# Patient Record
Sex: Female | Born: 1990 | Race: Black or African American | Hispanic: No | Marital: Single | Smoking: Former smoker
Health system: Southern US, Community
[De-identification: ages and names within clinical notes are randomized; demographics above are authoritative.]

## PROBLEM LIST (undated history)

## (undated) ENCOUNTER — Inpatient Hospital Stay (HOSPITAL_COMMUNITY): Payer: Self-pay

## (undated) DIAGNOSIS — J45909 Unspecified asthma, uncomplicated: Secondary | ICD-10-CM

## (undated) DIAGNOSIS — G8929 Other chronic pain: Secondary | ICD-10-CM

## (undated) DIAGNOSIS — D649 Anemia, unspecified: Secondary | ICD-10-CM

## (undated) DIAGNOSIS — F419 Anxiety disorder, unspecified: Secondary | ICD-10-CM

## (undated) DIAGNOSIS — M419 Scoliosis, unspecified: Secondary | ICD-10-CM

## (undated) DIAGNOSIS — O24419 Gestational diabetes mellitus in pregnancy, unspecified control: Secondary | ICD-10-CM

## (undated) DIAGNOSIS — M549 Dorsalgia, unspecified: Secondary | ICD-10-CM

## (undated) DIAGNOSIS — E871 Hypo-osmolality and hyponatremia: Secondary | ICD-10-CM

## (undated) HISTORY — DX: Other chronic pain: G89.29

## (undated) HISTORY — DX: Dorsalgia, unspecified: M54.9

## (undated) HISTORY — DX: Anxiety disorder, unspecified: F41.9

## (undated) HISTORY — PX: NO PAST SURGERIES: SHX2092

## (undated) HISTORY — DX: Hypo-osmolality and hyponatremia: E87.1

## (undated) HISTORY — DX: Hypocalcemia: E83.51

---

## 2011-06-25 ENCOUNTER — Emergency Department: Payer: Self-pay | Admitting: *Deleted

## 2014-12-29 ENCOUNTER — Emergency Department
Admission: EM | Admit: 2014-12-29 | Discharge: 2014-12-29 | Disposition: A | Discharge status: Home / Self Care | Payer: Self-pay | Attending: Emergency Medicine | Admitting: Emergency Medicine

## 2014-12-29 ENCOUNTER — Encounter: Payer: Self-pay | Admitting: *Deleted

## 2014-12-29 DIAGNOSIS — Z720 Tobacco use: Secondary | ICD-10-CM | POA: Insufficient documentation

## 2014-12-29 DIAGNOSIS — J020 Streptococcal pharyngitis: Secondary | ICD-10-CM | POA: Insufficient documentation

## 2014-12-29 HISTORY — DX: Unspecified asthma, uncomplicated: J45.909

## 2014-12-29 MED ORDER — AMOXICILLIN 500 MG PO CAPS
500.0000 mg | ORAL_CAPSULE | Freq: Once | ORAL | Status: AC
  Administered 2014-12-29: 500 mg via ORAL
  Filled 2014-12-29: qty 1

## 2014-12-29 MED ORDER — AMOXICILLIN 500 MG PO CAPS: 500.0000 mg | ORAL_CAPSULE | Freq: Three times a day (TID) | ORAL | Status: DC

## 2014-12-29 NOTE — ED Provider Notes (Signed)
[facility] Emergency Department Provider Note  ____________________________________________  Time seen: Approximately 7:54 PM  I have reviewed the triage vital signs and the nursing notes.   HISTORY  Chief Complaint Sore Throat  HPI Alexandria Beltran is a 24 y.o. female is here with complaint of sore throat and fever for 2 days. Patient states that she has a history of strep and this feels very similar. She states that she has had some fever but has not actually taken it. She is able to swallow the tablets and fluids. Throat pain is worse with swallowing. Currently her pain is 7 out of 10. Nothing has made her throat feel better.   Past Medical History  Diagnosis Date  . Asthma     There are no active problems to display for this patient.   History reviewed. No pertinent past surgical history.  Current Outpatient Rx  Name  Route  Sig  Dispense  Refill  . amoxicillin (AMOXIL) 500 MG capsule   Oral   Take 1 capsule (500 mg total) by mouth 3 (three) times daily.   30 capsule   0     Allergies Review of patient's allergies indicates no known allergies.  History reviewed. No pertinent family history.  Social History Social History  Substance Use Topics  . Smoking status: Current Every Day Smoker  . Smokeless tobacco: None  . Alcohol Use: Yes     Comment: occasionally    Review of Systems Constitutional: Positive fever/chills Eyes: No visual changes. ENT: Positive sore throat. Cardiovascular: Denies chest pain. Respiratory: Denies shortness of breath. Gastrointestinal: No abdominal pain.  No nausea, no vomiting.  Musculoskeletal: Negative for back pain. Skin: Negative for rash. Neurological: Negative for headaches, focal weakness or numbness.  10-point ROS otherwise negative.  ____________________________________________   PHYSICAL EXAM:  VITAL SIGNS: ED Triage Vitals  Enc Vitals Group     BP 12/29/14 1919 120/71 mmHg     Pulse  Rate 12/29/14 1919 107     Resp 12/29/14 1919 18     Temp 12/29/14 1919 98.2 F (36.8 C)     Temp Source 12/29/14 1919 Oral     SpO2 12/29/14 1919 97 %     Weight 12/29/14 1919 230 lb (104.327 kg)     Height 12/29/14 1919 6' (1.829 m)     Head Cir --      Peak Flow --      Pain Score 12/29/14 1921 7     Pain Loc --      Pain Edu? --      Excl. in GC? --     Constitutional: Alert and oriented. Well appearing and in no acute distress. Eyes: Conjunctivae are normal. PERRL. EOMI. Head: Atraumatic. Nose: No congestion/rhinnorhea. Mouth/Throat: Mucous membranes are moist.  Oropharynx moderate erythema with bilateral tonsillar exudate. Patient does talk with a muffled voice. Neck: No stridor.   Hematological/Lymphatic/Immunilogical: Positive Bilateral cervical lymphadenopathy. Cardiovascular: Normal rate, regular rhythm. Grossly normal heart sounds.  Good peripheral circulation. Respiratory: Normal respiratory effort.  No retractions. Lungs CTAB. Gastrointestinal: Soft and nontender. No distention. Musculoskeletal: No lower extremity tenderness nor edema.  No joint effusions. Neurologic:  Normal speech and language. No gross focal neurologic deficits are appreciated. No gait instability. Skin:  Skin is warm, dry and intact. No rash noted. Psychiatric: Mood and affect are normal. Speech and behavior are normal.  ____________________________________________   LABS (all labs ordered are listed, but only abnormal results are displayed)  Labs Reviewed -  No data to display    PROCEDURES  Procedure(s) performed: None  Critical Care performed: No  ____________________________________________   INITIAL IMPRESSION / ASSESSMENT AND PLAN / ED COURSE  Pertinent labs & imaging results that were available during my care of the patient were reviewed by me and considered in my medical decision making (see chart for details).  Patient was started on amoxicillin 500 mg tonight in the  emergency room and given a prescription for a course for 10 days. She is not having any difficulty swallowing and is encouraged to drink plenty of fluids. Also Tylenol or ibuprofen as needed for throat pain. ____________________________________________   FINAL CLINICAL IMPRESSION(S) / ED DIAGNOSES  Final diagnoses:  Pharyngitis, streptococcal, acute      Tommi Rumps, PA-C 12/29/14 2053  Jene Every, MD 12/29/14 [phone]

## 2014-12-29 NOTE — ED Notes (Signed)
Discussed discharge instructions, prescriptions, and follow-up care with patient. No questions or concerns at this time. Pt stable at discharge.  

## 2014-12-29 NOTE — ED Notes (Signed)
Patient c/o sore throat, fever for two days. Patient states she has a history of strep throat.

## 2015-01-04 LAB — POCT RAPID STREP A: Streptococcus, Group A Screen (Direct): POSITIVE — AB

## 2016-02-10 LAB — OB RESULTS CONSOLE HEPATITIS B SURFACE ANTIGEN: HEP B S AG: NEGATIVE

## 2016-02-10 LAB — OB RESULTS CONSOLE ABO/RH: RH Type: POSITIVE

## 2016-02-10 LAB — OB RESULTS CONSOLE RPR: RPR: NONREACTIVE

## 2016-02-10 LAB — CYTOLOGY - PAP: Pap: NEGATIVE

## 2016-02-10 LAB — OB RESULTS CONSOLE RUBELLA ANTIBODY, IGM: RUBELLA: IMMUNE

## 2016-02-10 LAB — OB RESULTS CONSOLE HIV ANTIBODY (ROUTINE TESTING): HIV: NONREACTIVE

## 2016-02-10 LAB — OB RESULTS CONSOLE GC/CHLAMYDIA
Chlamydia: NEGATIVE
Gonorrhea: NEGATIVE

## 2016-02-10 LAB — OB RESULTS CONSOLE ANTIBODY SCREEN: Antibody Screen: NEGATIVE

## 2016-02-10 LAB — OB RESULTS CONSOLE VARICELLA ZOSTER ANTIBODY, IGG: Varicella: IMMUNE

## 2016-02-24 NOTE — L&D Delivery Note (Signed)
26 y.o. G1P0 at 162w3d delivered a viable female infant in cephalic, ROA position. No nuchal cord. Left anterior shoulder delivered with ease. 60 sec delayed cord clamping. Cord clamped x2 and cut. Placenta delivered spontaneously intact, with 3VC. Fundus firm on exam with massage and pitocin. Good hemostasis noted.  Anesthesia: Epidural; local anesthesia Laceration: Hymenal tear in right vagina Suture: 3-0 Vicryl Good hemostasis noted. EBL: 300cc  Mom and baby recovering in LDR.    Apgars: APGAR (1 MIN): 8   APGAR (5 MINS): 9      Weight: Pending skin to skin  Sponge and instrument count were correct x2. Placenta sent to L&D  Jen MowElizabeth Mumaw, DO [facility] 07/24/2016, 12:42 PM

## 2016-05-28 ENCOUNTER — Encounter: Payer: Self-pay | Admitting: Emergency Medicine

## 2016-05-28 ENCOUNTER — Emergency Department (HOSPITAL_COMMUNITY)
Admission: EM | Admit: 2016-05-28 | Discharge: 2016-05-28 | Disposition: A | Discharge status: Home / Self Care | Payer: Medicaid Other | Attending: Emergency Medicine | Admitting: Emergency Medicine

## 2016-05-28 DIAGNOSIS — O99333 Smoking (tobacco) complicating pregnancy, third trimester: Secondary | ICD-10-CM | POA: Diagnosis not present

## 2016-05-28 DIAGNOSIS — Z3490 Encounter for supervision of normal pregnancy, unspecified, unspecified trimester: Secondary | ICD-10-CM

## 2016-05-28 DIAGNOSIS — Z79899 Other long term (current) drug therapy: Secondary | ICD-10-CM | POA: Diagnosis not present

## 2016-05-28 DIAGNOSIS — F17200 Nicotine dependence, unspecified, uncomplicated: Secondary | ICD-10-CM | POA: Insufficient documentation

## 2016-05-28 DIAGNOSIS — Z3A32 32 weeks gestation of pregnancy: Secondary | ICD-10-CM | POA: Diagnosis not present

## 2016-05-28 DIAGNOSIS — J45909 Unspecified asthma, uncomplicated: Secondary | ICD-10-CM | POA: Diagnosis not present

## 2016-05-28 DIAGNOSIS — O9989 Other specified diseases and conditions complicating pregnancy, childbirth and the puerperium: Secondary | ICD-10-CM | POA: Diagnosis present

## 2016-05-28 DIAGNOSIS — O99513 Diseases of the respiratory system complicating pregnancy, third trimester: Secondary | ICD-10-CM | POA: Diagnosis not present

## 2016-05-28 DIAGNOSIS — J0100 Acute maxillary sinusitis, unspecified: Secondary | ICD-10-CM

## 2016-05-28 MED ORDER — AMOXICILLIN 500 MG PO CAPS
500.0000 mg | ORAL_CAPSULE | Freq: Once | ORAL | Status: AC
  Administered 2016-05-28: 500 mg via ORAL
  Filled 2016-05-28: qty 1

## 2016-05-28 MED ORDER — AMOXICILLIN 500 MG PO CAPS: 500.0000 mg | ORAL_CAPSULE | Freq: Three times a day (TID) | ORAL | 0 refills | Status: DC

## 2016-05-28 NOTE — ED Triage Notes (Signed)
Pt reports she thinks she has a sinus infection and describes symptoms such as nasal congestion, asthma, and sore throat; onset two days ago. She is also 8 months pregnant but denies any abnormal pain or cramping. She [facility] but decided to come here because she isnt sure what to take for her sinus infection due to her pregnancy.

## 2016-05-28 NOTE — ED Notes (Signed)
Patient was alert, oriented and stable upon discharge. RN went over AVS and patient had no further questions.  

## 2016-05-28 NOTE — ED Notes (Signed)
ED Provider at bedside. 

## 2016-05-28 NOTE — Discharge Instructions (Signed)
Prescription for antibiotic. Increase fluids. Tylenol. Gargle with salt water

## 2016-05-30 NOTE — ED Provider Notes (Signed)
[facility]-EMERGENCY DEPT Provider Note   CSN: [id number] Arrival date & time: 05/28/16  1455     History   Chief Complaint Chief Complaint  Patient presents with  . Nasal Congestion    HPI Deniz Eskridge is a 26 y.o. female.  Pregnant woman presents with a sensation of sinus congestion and nasal discharge. No fever, sweats, chills, vaginal bleeding, vaginal discharge. He is approximately 8 months pregnant. Severity of symptoms is mild.      Past Medical History:  Diagnosis Date  . Asthma     There are no active problems to display for this patient.   No past surgical history on file.  OB History    Gravida Para Term Preterm AB Living   1             SAB TAB Ectopic Multiple Live Births                   Home Medications    Prior to Admission medications   Medication Sig Start Date End Date Taking? Authorizing Provider  amoxicillin (AMOXIL) 500 MG capsule Take 1 capsule (500 mg total) by mouth 3 (three) times daily. 05/28/16   Donnetta Hutching, MD    Family History No family history on file.  Social History Social History  Substance Use Topics  . Smoking status: Current Every Day Smoker  . Smokeless tobacco: Not on file  . Alcohol use Yes     Comment: occasionally     Allergies   Patient has no known allergies.   Review of Systems Review of Systems  All other systems reviewed and are negative.    Physical Exam Updated Vital Signs BP 123/72 (BP Location: Right Arm)   Pulse 100   Temp 98.2 F (36.8 C) (Oral)   Resp 17   SpO2 94%   Physical Exam  Constitutional: She is oriented to person, place, and time. She appears well-developed and well-nourished.  HENT:  Head: Normocephalic and atraumatic.  Bilateral tenderness over the maxillary sinuses  Eyes: Conjunctivae are normal.  Neck: Neck supple.  Cardiovascular: Normal rate and regular rhythm.   Pulmonary/Chest: Effort normal and breath sounds normal.  Abdominal:  Gravid, nontender    Musculoskeletal: Normal range of motion.  Neurological: She is alert and oriented to person, place, and time.  Skin: Skin is warm and dry.  Psychiatric: She has a normal mood and affect. Her behavior is normal.  Nursing note and vitals reviewed.    ED Treatments / Results  Labs (all labs ordered are listed, but only abnormal results are displayed) Labs Reviewed - No data to display  EKG  EKG Interpretation None       Radiology No results found.  Procedures Procedures (including critical care time)  Medications Ordered in ED Medications  amoxicillin (AMOXIL) capsule 500 mg (500 mg Oral Given 05/28/16 1536)     Initial Impression / Assessment and Plan / ED Course  I have reviewed the triage vital signs and the nursing notes.  Pertinent labs & imaging results that were available during my care of the patient were reviewed by me and considered in my medical decision making (see chart for details).     Patient is nontoxic-appearing. Will Rx amoxicillin for maxillary sinusitis. No evidence of pregnancy related issues.  Final Clinical Impressions(s) / ED Diagnoses   Final diagnoses:  Acute non-recurrent maxillary sinusitis  Pregnancy, unspecified gestational age    New Prescriptions Discharge Medication List as of 05/28/2016  3:32  PM       Donnetta Hutching, MD 05/30/16 1409

## 2016-06-22 ENCOUNTER — Emergency Department (HOSPITAL_COMMUNITY)
Admission: EM | Admit: 2016-06-22 | Discharge: 2016-06-22 | Disposition: A | Discharge status: Home / Self Care | Payer: Medicaid Other | Attending: Emergency Medicine | Admitting: Emergency Medicine

## 2016-06-22 ENCOUNTER — Emergency Department (HOSPITAL_COMMUNITY): Payer: Medicaid Other

## 2016-06-22 ENCOUNTER — Encounter (HOSPITAL_COMMUNITY): Payer: Self-pay | Admitting: Emergency Medicine

## 2016-06-22 DIAGNOSIS — F17200 Nicotine dependence, unspecified, uncomplicated: Secondary | ICD-10-CM | POA: Diagnosis not present

## 2016-06-22 DIAGNOSIS — Z3A37 37 weeks gestation of pregnancy: Secondary | ICD-10-CM | POA: Diagnosis not present

## 2016-06-22 DIAGNOSIS — J45909 Unspecified asthma, uncomplicated: Secondary | ICD-10-CM

## 2016-06-22 DIAGNOSIS — O99333 Smoking (tobacco) complicating pregnancy, third trimester: Secondary | ICD-10-CM | POA: Diagnosis not present

## 2016-06-22 DIAGNOSIS — O99513 Diseases of the respiratory system complicating pregnancy, third trimester: Secondary | ICD-10-CM | POA: Insufficient documentation

## 2016-06-22 MED ORDER — ALBUTEROL SULFATE HFA 108 (90 BASE) MCG/ACT IN AERS
2.0000 | INHALATION_SPRAY | Freq: Once | RESPIRATORY_TRACT | Status: AC
  Administered 2016-06-22: 2 via RESPIRATORY_TRACT
  Filled 2016-06-22: qty 6.7

## 2016-06-22 MED ORDER — CETIRIZINE HCL 10 MG PO TABS: 10.0000 mg | ORAL_TABLET | Freq: Every day | ORAL | 0 refills | Status: DC

## 2016-06-22 NOTE — ED Triage Notes (Signed)
Patient c/o SOB and cough that is non-productive since yesterday. Pt has asthma

## 2016-06-22 NOTE — Discharge Instructions (Signed)
Start taking zyrtec as prescribed. Use inhaler 2 puffs every 4 hrs as needed. Try to sleep on the side, prop your belly with some pillow or try to sleep reclined. Follow up with your provider [facility] have any [facility] related problems.

## 2016-06-22 NOTE — ED Notes (Signed)
Patient transported to X-ray 

## 2016-06-22 NOTE — ED Provider Notes (Signed)
[facility]-EMERGENCY DEPT Provider Note   CSN: [id number] Arrival date & time: 06/22/16  1516     History   Chief Complaint Chief Complaint  Patient presents with  . Shortness of Breath  . Cough    HPI Alexandria Beltran is a 26 y.o. female.  HPI Alexandria Beltran is a 26 y.o. female presents to emergency department complaining of shortness of breath. Patient is [redacted] weeks pregnant, G1P0. She states that when she goes to sleep, she is unable to lay down flat and feels short of breath. She states she normally sleeps on her back. She states she feels better and sitting and reclining position. She denies any shortness of breath at any other time. She also reports associated nasal congestion which she has had for about 2 weeks and attributes it to seasonal allergies. She also reports some acid reflux. She denies any abdominal pain. No vaginal discharge or bleeding. States she is feeling baby move normally. Patient is not taking any medications other than Tylenol. She does have history of asthma but states her asthma has been well under control recently. She denies any wheezing or cough. She has not tried taking inhaler. States she is unable to sleep at night because of the discomfort and unable to find a comfortable position.  Past Medical History:  Diagnosis Date  . Asthma     There are no active problems to display for this patient.   History reviewed. No pertinent surgical history.  OB History    Gravida Para Term Preterm AB Living   1             SAB TAB Ectopic Multiple Live Births                   Home Medications    Prior to Admission medications   Medication Sig Start Date End Date Taking? Authorizing Provider  cholecalciferol (VITAMIN D) 1000 units tablet Take 1,000 Units by mouth daily.   Yes Historical Provider, MD  Prenatal Vit-Fe Fumarate-FA (PREPLUS) 27-1 MG TABS Take 1 tablet by mouth daily. 05/15/16  Yes Historical Provider, MD  amoxicillin (AMOXIL) 500 MG capsule Take 1  capsule (500 mg total) by mouth 3 (three) times daily. Patient not taking: Reported on 06/22/2016 05/28/16   Donnetta Hutching, MD    Family History No family history on file.  Social History Social History  Substance Use Topics  . Smoking status: Current Every Day Smoker  . Smokeless tobacco: Never Used  . Alcohol use Yes     Comment: occasionally     Allergies   Patient has no known allergies.   Review of Systems Review of Systems  Constitutional: Negative for chills and fever.  HENT: Positive for congestion.   Respiratory: Positive for shortness of breath. Negative for cough and chest tightness.   Cardiovascular: Negative for chest pain, palpitations and leg swelling.  Gastrointestinal: Negative for abdominal pain, diarrhea, nausea and vomiting.  Genitourinary: Negative for dysuria, flank pain, pelvic pain, vaginal bleeding, vaginal discharge and vaginal pain.  Musculoskeletal: Negative for arthralgias, myalgias, neck pain and neck stiffness.  Skin: Negative for rash.  Neurological: Negative for dizziness, weakness and headaches.  All other systems reviewed and are negative.    Physical Exam Updated Vital Signs BP 104/68   Pulse 98   Temp 97.7 F (36.5 C) (Oral)   Resp 18   Ht  (1.803 m)   Wt 117.9 kg   SpO2 98%   BMI 36.26 kg/m  Physical Exam  Constitutional: She is oriented to person, place, and time. She appears well-developed and well-nourished. No distress.  HENT:  Head: Normocephalic.  Clear nasal congestion  Eyes: Conjunctivae are normal.  Neck: Neck supple.  Cardiovascular: Normal rate, regular rhythm and normal heart sounds.   Pulmonary/Chest: Effort normal and breath sounds normal. No respiratory distress. She has no wheezes. She has no rales.  Abdominal: Soft. Bowel sounds are normal. She exhibits no distension. There is no tenderness. There is no rebound.  Gravid. Fundus above umbilicus  Musculoskeletal: She exhibits no edema.  Neurological:  She is alert and oriented to person, place, and time.  Skin: Skin is warm and dry.  Psychiatric: She has a normal mood and affect. Her behavior is normal.  Nursing note and vitals reviewed.    ED Treatments / Results  Labs (all labs ordered are listed, but only abnormal results are displayed) Labs Reviewed - No data to display  EKG  EKG Interpretation None       Radiology No results found.  Procedures Procedures (including critical care time)  Medications Ordered in ED Medications - No data to display   Initial Impression / Assessment and Plan / ED Course  I have reviewed the triage vital signs and the nursing notes.  Pertinent labs & imaging results that were available during my care of the patient were reviewed by me and considered in my medical decision making (see chart for details).     With shortness of breath, mainly when laying down at night. She is [redacted] weeks pregnant today. She denies abdominal pain, vaginal discharge, bleeding, no pregnancy related issues. Her vital signs are normal. She is not hypertensive. She is not hypoxic. She has no symptoms, unless laying down flat. She does not appear to be fluid overloaded. Her lungs are clear. Chest x-ray obtained to rule out pneumonia or CHF, and is negative other than some bronchitic changes. I will start her on inhaler, Zyrtec for congestion. Instructed to sleep propped or reclined. Follow-up with OB/GYN. She is not having any chest pain. She is not hypoxic. I do not suspect PE. She is currently asymptomatic.  Vitals:   06/22/16 1524  BP: 104/68  Pulse: 98  Resp: 18  Temp: 97.7 F (36.5 C)     Final Clinical Impressions(s) / ED Diagnoses   Final diagnoses:  Uncomplicated asthma, unspecified asthma severity, unspecified whether persistent  [redacted] weeks gestation of pregnancy    New Prescriptions New Prescriptions   CETIRIZINE (ZYRTEC) 10 MG TABLET    Take 1 tablet (10 mg total) by mouth daily.     Jaynie Crumble, PA-C 06/22/16 1712    Azalia Bilis, MD 06/22/16 [phone]

## 2016-06-26 LAB — OB RESULTS CONSOLE GC/CHLAMYDIA
CHLAMYDIA, DNA PROBE: NEGATIVE
GC PROBE AMP, GENITAL: NEGATIVE

## 2016-06-26 LAB — OB RESULTS CONSOLE GBS: GBS: NEGATIVE

## 2016-07-10 ENCOUNTER — Other Ambulatory Visit (HOSPITAL_COMMUNITY): Payer: Self-pay | Admitting: Nurse Practitioner

## 2016-07-10 DIAGNOSIS — O480 Post-term pregnancy: Secondary | ICD-10-CM

## 2016-07-17 ENCOUNTER — Ambulatory Visit (HOSPITAL_COMMUNITY)
Admission: RE | Admit: 2016-07-17 | Discharge: 2016-07-17 | Disposition: A | Discharge status: Home / Self Care | Payer: Medicaid Other | Source: Ambulatory Visit | Attending: Nurse Practitioner | Admitting: Nurse Practitioner

## 2016-07-17 ENCOUNTER — Other Ambulatory Visit: Payer: Self-pay | Admitting: Advanced Practice Midwife

## 2016-07-17 DIAGNOSIS — Z3A40 40 weeks gestation of pregnancy: Secondary | ICD-10-CM | POA: Diagnosis not present

## 2016-07-17 DIAGNOSIS — O480 Post-term pregnancy: Secondary | ICD-10-CM

## 2016-07-21 ENCOUNTER — Telehealth (HOSPITAL_COMMUNITY): Payer: Self-pay | Admitting: *Deleted

## 2016-07-21 ENCOUNTER — Encounter (HOSPITAL_COMMUNITY): Payer: Self-pay | Admitting: *Deleted

## 2016-07-21 NOTE — Telephone Encounter (Signed)
Preadmission screen  

## 2016-07-23 ENCOUNTER — Inpatient Hospital Stay (HOSPITAL_COMMUNITY): Payer: Medicaid Other | Admitting: Anesthesiology

## 2016-07-23 ENCOUNTER — Inpatient Hospital Stay (HOSPITAL_COMMUNITY)
Admission: RE | Admit: 2016-07-23 | Discharge: 2016-07-26 | DRG: 775 | Disposition: A | Discharge status: Home / Self Care | Payer: Medicaid Other | Source: Ambulatory Visit | Attending: Obstetrics and Gynecology | Admitting: Obstetrics and Gynecology

## 2016-07-23 ENCOUNTER — Encounter (HOSPITAL_COMMUNITY): Payer: Self-pay

## 2016-07-23 DIAGNOSIS — F17210 Nicotine dependence, cigarettes, uncomplicated: Secondary | ICD-10-CM | POA: Diagnosis present

## 2016-07-23 DIAGNOSIS — J45909 Unspecified asthma, uncomplicated: Secondary | ICD-10-CM | POA: Diagnosis present

## 2016-07-23 DIAGNOSIS — O9952 Diseases of the respiratory system complicating childbirth: Secondary | ICD-10-CM | POA: Diagnosis present

## 2016-07-23 DIAGNOSIS — O480 Post-term pregnancy: Secondary | ICD-10-CM | POA: Diagnosis present

## 2016-07-23 DIAGNOSIS — Z3A41 41 weeks gestation of pregnancy: Secondary | ICD-10-CM

## 2016-07-23 DIAGNOSIS — O99334 Smoking (tobacco) complicating childbirth: Secondary | ICD-10-CM | POA: Diagnosis present

## 2016-07-23 HISTORY — DX: Scoliosis, unspecified: M41.9

## 2016-07-23 HISTORY — DX: Anemia, unspecified: D64.9

## 2016-07-23 LAB — ABO/RH: ABO/RH(D): O POS

## 2016-07-23 LAB — CBC
HEMATOCRIT: 29.2 % — AB (ref 36.0–46.0)
Hemoglobin: 9.6 g/dL — ABNORMAL LOW (ref 12.0–15.0)
MCH: 29.3 pg (ref 26.0–34.0)
MCHC: 32.9 g/dL (ref 30.0–36.0)
MCV: 89 fL (ref 78.0–100.0)
Platelets: 247 10*3/uL (ref 150–400)
RBC: 3.28 MIL/uL — AB (ref 3.87–5.11)
RDW: 13.7 % (ref 11.5–15.5)
WBC: 8.6 10*3/uL (ref 4.0–10.5)

## 2016-07-23 LAB — RPR: RPR Ser Ql: NONREACTIVE

## 2016-07-23 LAB — TYPE AND SCREEN
ABO/RH(D): O POS
ANTIBODY SCREEN: NEGATIVE

## 2016-07-23 MED ORDER — TERBUTALINE SULFATE 1 MG/ML IJ SOLN
0.2500 mg | Freq: Once | INTRAMUSCULAR | Status: DC | PRN
  Filled 2016-07-23: qty 1

## 2016-07-23 MED ORDER — DIPHENHYDRAMINE HCL 50 MG/ML IJ SOLN: 12.5000 mg | INTRAMUSCULAR | Status: DC | PRN

## 2016-07-23 MED ORDER — EPHEDRINE 5 MG/ML INJ: 10.0000 mg | INTRAVENOUS | Status: DC | PRN

## 2016-07-23 MED ORDER — OXYTOCIN 40 UNITS IN LACTATED RINGERS INFUSION - SIMPLE MED
1.0000 m[IU]/min | INTRAVENOUS | Status: DC
  Administered 2016-07-23: 2 m[IU]/min via INTRAVENOUS
  Administered 2016-07-24: 22 m[IU]/min via INTRAVENOUS
  Filled 2016-07-23 (×2): qty 1000

## 2016-07-23 MED ORDER — LACTATED RINGERS IV SOLN: 500.0000 mL | Freq: Once | INTRAVENOUS | Status: DC

## 2016-07-23 MED ORDER — PENICILLIN G POT IN DEXTROSE 60000 UNIT/ML IV SOLN: 3.0000 10*6.[IU] | INTRAVENOUS | Status: DC

## 2016-07-23 MED ORDER — DIPHENHYDRAMINE HCL 50 MG/ML IJ SOLN
12.5000 mg | INTRAMUSCULAR | Status: AC | PRN
  Administered 2016-07-23 – 2016-07-24 (×3): 12.5 mg via INTRAVENOUS
  Filled 2016-07-23 (×2): qty 1

## 2016-07-23 MED ORDER — LACTATED RINGERS IV SOLN
500.0000 mL | INTRAVENOUS | Status: DC | PRN
  Administered 2016-07-24: 500 mL via INTRAVENOUS

## 2016-07-23 MED ORDER — OXYTOCIN 40 UNITS IN LACTATED RINGERS INFUSION - SIMPLE MED: 2.5000 [IU]/h | INTRAVENOUS | Status: DC

## 2016-07-23 MED ORDER — PHENYLEPHRINE 40 MCG/ML (10ML) SYRINGE FOR IV PUSH (FOR BLOOD PRESSURE SUPPORT)
80.0000 ug | PREFILLED_SYRINGE | INTRAVENOUS | Status: DC | PRN
  Filled 2016-07-23: qty 5

## 2016-07-23 MED ORDER — PHENYLEPHRINE 40 MCG/ML (10ML) SYRINGE FOR IV PUSH (FOR BLOOD PRESSURE SUPPORT): 80.0000 ug | PREFILLED_SYRINGE | INTRAVENOUS | Status: DC | PRN

## 2016-07-23 MED ORDER — FENTANYL 2.5 MCG/ML BUPIVACAINE 1/10 % EPIDURAL INFUSION (WH - ANES): 14.0000 mL/h | INTRAMUSCULAR | Status: DC | PRN

## 2016-07-23 MED ORDER — SOD CITRATE-CITRIC ACID 500-334 MG/5ML PO SOLN: 30.0000 mL | ORAL | Status: DC | PRN

## 2016-07-23 MED ORDER — ONDANSETRON HCL 4 MG/2ML IJ SOLN: 4.0000 mg | Freq: Four times a day (QID) | INTRAMUSCULAR | Status: DC | PRN

## 2016-07-23 MED ORDER — MISOPROSTOL 25 MCG QUARTER TABLET
25.0000 ug | ORAL_TABLET | ORAL | Status: DC | PRN
  Administered 2016-07-23 (×2): 25 ug via VAGINAL
  Filled 2016-07-23 (×3): qty 1

## 2016-07-23 MED ORDER — PHENYLEPHRINE 40 MCG/ML (10ML) SYRINGE FOR IV PUSH (FOR BLOOD PRESSURE SUPPORT)
80.0000 ug | PREFILLED_SYRINGE | INTRAVENOUS | Status: DC | PRN
  Filled 2016-07-23 (×2): qty 10
  Filled 2016-07-23: qty 5

## 2016-07-23 MED ORDER — OXYTOCIN BOLUS FROM INFUSION: 500.0000 mL | Freq: Once | INTRAVENOUS | Status: DC

## 2016-07-23 MED ORDER — LACTATED RINGERS IV SOLN
INTRAVENOUS | Status: DC
  Administered 2016-07-23 – 2016-07-24 (×3): via INTRAVENOUS

## 2016-07-23 MED ORDER — ALBUTEROL SULFATE (2.5 MG/3ML) 0.083% IN NEBU: 2.5000 mg | INHALATION_SOLUTION | RESPIRATORY_TRACT | Status: DC | PRN

## 2016-07-23 MED ORDER — FENTANYL 2.5 MCG/ML BUPIVACAINE 1/10 % EPIDURAL INFUSION (WH - ANES)
14.0000 mL/h | INTRAMUSCULAR | Status: DC | PRN
  Administered 2016-07-23 – 2016-07-24 (×3): 14 mL/h via EPIDURAL
  Filled 2016-07-23 (×3): qty 100

## 2016-07-23 MED ORDER — OXYCODONE-ACETAMINOPHEN 5-325 MG PO TABS: 1.0000 | ORAL_TABLET | ORAL | Status: DC | PRN

## 2016-07-23 MED ORDER — EPHEDRINE 5 MG/ML INJ
10.0000 mg | INTRAVENOUS | Status: DC | PRN
  Filled 2016-07-23: qty 2

## 2016-07-23 MED ORDER — OXYCODONE-ACETAMINOPHEN 5-325 MG PO TABS: 2.0000 | ORAL_TABLET | ORAL | Status: DC | PRN

## 2016-07-23 MED ORDER — FENTANYL CITRATE (PF) 100 MCG/2ML IJ SOLN
100.0000 ug | INTRAMUSCULAR | Status: DC | PRN
  Administered 2016-07-23 (×3): 100 ug via INTRAVENOUS
  Filled 2016-07-23 (×3): qty 2

## 2016-07-23 MED ORDER — LIDOCAINE HCL (PF) 1 % IJ SOLN
INTRAMUSCULAR | Status: DC | PRN
  Administered 2016-07-23 (×2): 5 mL via EPIDURAL

## 2016-07-23 MED ORDER — ACETAMINOPHEN 325 MG PO TABS: 650.0000 mg | ORAL_TABLET | ORAL | Status: DC | PRN

## 2016-07-23 MED ORDER — LIDOCAINE HCL (PF) 1 % IJ SOLN
30.0000 mL | INTRAMUSCULAR | Status: DC | PRN
  Administered 2016-07-24: 30 mL via SUBCUTANEOUS
  Filled 2016-07-23: qty 30

## 2016-07-23 NOTE — H&P (Signed)
LABOR AND DELIVERY ADMISSION HISTORY AND PHYSICAL NOTE  Alexandria Beltran is a 26 y.o. female G1P0 with IUP at 5639w2d by 17 wk US and LMP presenting for post dates induction from the health department..   She reports positive fetal movement. She denies leakage of fluid or vaginal bleeding.  Prenatal History/Complications:  Past Medical History: Past Medical History:  Diagnosis Date  . Anemia   . Asthma    Inhaler used 07/22/16    Past Surgical History: Past Surgical History:  Procedure Laterality Date  . NO PAST SURGERIES      Obstetrical History: OB History    Gravida Para Term Preterm AB Living   1             SAB TAB Ectopic Multiple Live Births                  Social History: Social History   Social History  . Marital status: Single    Spouse name: N/A  . Number of children: N/A  . Years of education: N/A   Social History Main Topics  . Smoking status: Current Every Day Smoker    Packs/day: 0.25    Years: 6.00    Types: Cigarettes  . Smokeless tobacco: Never Used  . Alcohol use Yes     Comment: occasionally (not with pregnancy)  . Drug use: No  . Sexual activity: Yes    Birth control/ protection: Implant, Injection     Comment: undecided between two   Other Topics Concern  . None   Social History Narrative  . None    Family History: Family History  Problem Relation Age of Onset  . Cancer Mother   . Diabetes Mother   . Alcohol abuse Mother   . Alcohol abuse Maternal Aunt   . Cancer Maternal Uncle   . Alcohol abuse Maternal Uncle     Allergies: No Known Allergies  Prescriptions Prior to Admission  Medication Sig Dispense Refill Last Dose  . Prenatal Vit-Fe Fumarate-FA (PREPLUS) 27-1 MG TABS Take 1 tablet by mouth daily.  0 07/22/2016 at Unknown time     Review of Systems   All systems reviewed and negative except as stated in HPI  Blood pressure 134/79, pulse 89, temperature 98.3 F (36.8 C), temperature source Oral, resp. rate 17,  height 6' (1.829 m), weight 260 lb (117.9 kg). General appearance: alert, cooperative and appears stated age Lungs: clear to auscultation bilaterally Heart: regular rate and rhythm Abdomen: soft, non-tender; bowel sounds normal Extremities: No calf swelling or tenderness Presentation: cephalic by nursing exam Fetal monitoring: category 1 FHTS: 135/moderate/reactive Uterine activity: irregular Dilation: 1 Effacement (%): Thick Station: -3 Exam by:: Genevie AnnSchenk, MD   Prenatal labs: ABO, Rh: O/Positive/-- (12/18 0000) Antibody: Negative (12/18 0000) Rubella:  Non-immune RPR: Nonreactive (12/18 0000)  HBsAg: Negative (12/18 0000)  HIV: Non-reactive (12/18 0000)  GBS: Negative (05/04 0000)  1 hr Glucola: 105 Genetic screening:  Quad negative Anatomy US: normal  Prenatal Transfer Tool  Maternal Diabetes: No Genetic Screening: Normal Maternal Ultrasounds/Referrals: Normal Fetal Ultrasounds or other Referrals:  None Maternal Substance Abuse:  No Significant Maternal Medications:  None Significant Maternal Lab Results: Lab values include: Group B Strep negative  Results for orders placed or performed during the hospital encounter of 07/23/16 (from the past 24 hour(s))  CBC   Collection Time: 07/23/16  8:08 AM  Result Value Ref Range   WBC 8.6 4.0 - 10.5 K/uL   RBC 3.28 (L) 3.87 -  5.11 MIL/uL   Hemoglobin 9.6 (L) 12.0 - 15.0 g/dL   HCT 16.1 (L) 09.6 - 04.5 %   MCV 89.0 78.0 - 100.0 fL   MCH 29.3 26.0 - 34.0 pg   MCHC 32.9 30.0 - 36.0 g/dL   RDW 40.9 81.1 - 91.4 %   Platelets 247 150 - 400 K/uL    Patient Active Problem List   Diagnosis Date Noted  . Post term pregnancy at [redacted] weeks gestation 07/23/2016    Assessment: Alexandria Beltran is a 26 y.o. G1P0 at [redacted]w[redacted]d here for IOL for postdates  #Labor: expect SVD, induce with cytotec and foley #Pain: IV pain medication and epidural when able #FWB: Category 1 #ID:  GBS negative #MOF:  breast   Alexandria Beltran 07/23/2016, 9:11  AM

## 2016-07-23 NOTE — Anesthesia Preprocedure Evaluation (Signed)
Anesthesia Evaluation  Patient identified by MRN, date of birth, ID band Patient awake    Reviewed: Allergy & Precautions, NPO status , Patient's Chart, lab work & pertinent test results  Airway Mallampati: II   Neck ROM: full    Dental   Pulmonary asthma , Current Smoker,    breath sounds clear to auscultation       Cardiovascular negative cardio ROS   Rhythm:regular Rate:Normal     Neuro/Psych    GI/Hepatic   Endo/Other    Renal/GU      Musculoskeletal   Abdominal   Peds  Hematology   Anesthesia Other Findings   Reproductive/Obstetrics (+) Pregnancy                             Anesthesia Physical Anesthesia Plan  ASA: II  Anesthesia Plan: Epidural   Post-op Pain Management:    Induction: Intravenous  Airway Management Planned: Natural Airway  Additional Equipment:   Intra-op Plan:   Post-operative Plan:   Informed Consent: I have reviewed the patients History and Physical, chart, labs and discussed the procedure including the risks, benefits and alternatives for the proposed anesthesia with the patient or authorized representative who has indicated his/her understanding and acceptance.     Plan Discussed with: Anesthesiologist  Anesthesia Plan Comments:         Anesthesia Quick Evaluation

## 2016-07-23 NOTE — Anesthesia Pain Management Evaluation Note (Signed)
  [facility] Pain Management Visit Note  Patient: Alexandria LeaverSieda Damron, 26 y.o., female  "Hello I am a [facility]. We have an anesthesia team available at all times to provide care throughout the hospital, including epidural management and anesthesia for C-section. I don't know your plan for the delivery whether it a natural birth, water birth, IV sedation, nitrous supplementation, doula or epidural, but we want to meet your pain goals."   1.Was your pain managed to your expectations on prior hospitalizations?   No prior hospitalizations  2.What is your expectation for pain management during this hospitalization?     Labor support without medications  3.How can we help you reach that goal?   Record the patient's initial score and the patient's pain goal.   Pain: 0  Pain Goal: 3 [facility] wants you to be able to say your pain was always managed very well.  Laban EmperorMalinova,Rayce Brahmbhatt Hristova 07/23/2016

## 2016-07-23 NOTE — Progress Notes (Signed)
Labor Progress Note  Alexandria Beltran is a 26 y.o. G1P0 at 3461w2d  admitted for induction of labor due to Post dates.   S: Patient currently comfortable with epidural.   O:  BP (!) 104/43   Pulse 94   Temp 98.2 F (36.8 C) (Oral)   Resp 20   Ht 6' (1.829 m)   Wt 117.9 kg (260 lb)   SpO2 95%   BMI 35.26 kg/m   No intake/output data recorded.  FHT:  FHR: 145 bpm, variability: moderate,  accelerations:  Present,  decelerations:  Absent UC:   irregular SVE:   Dilation: 5.5 Effacement (%): 50 Station: -2 Exam by:: F. Cresenzo-Dishmon, CNM   Pitocin @ 10 mu/min  Labs: Lab Results  Component Value Date   WBC 8.6 07/23/2016   HGB 9.6 (L) 07/23/2016   HCT 29.2 (L) 07/23/2016   MCV 89.0 07/23/2016   PLT 247 07/23/2016    Assessment / Plan: 26 y.o. G1P0 5761w2d in early labor Induction of labor due to postterm,  progressing well on pitocin  Labor: Progressing on Pitocin, will continue to increase then AROM and IUPC Fetal Wellbeing:  Category I Pain Control:  Epidural Anticipated MOD:  NSVD  Expectant management  Alexandria PouchLauren Wesly Whisenant, MD PGY-1 [facility]

## 2016-07-23 NOTE — Anesthesia Procedure Notes (Signed)
Epidural Patient location during procedure: OB Start time: 07/23/2016 9:32 PM End time: 07/23/2016 9:43 PM  Staffing Anesthesiologist: Chaney MallingHODIERNE, Watt Geiler Performed: anesthesiologist   Preanesthetic Checklist Completed: patient identified, site marked, pre-op evaluation, timeout performed, IV checked, risks and benefits discussed and monitors and equipment checked  Epidural Patient position: sitting Prep: DuraPrep Patient monitoring: heart rate, cardiac monitor, continuous pulse ox and blood pressure Approach: midline Location: L2-L3 Injection technique: LOR saline  Needle:  Needle type: Tuohy  Needle gauge: 17 G Needle length: 15 cm Needle insertion depth: 11 cm Catheter type: closed end flexible Catheter size: 19 Gauge Catheter at skin depth: 16 cm Test dose: negative and Other  Assessment Events: blood not aspirated, injection not painful, no injection resistance and negative IV test  Additional Notes Informed consent obtained prior to proceeding including risk of failure, 1% risk of PDPH, risk of minor discomfort and bruising.  Discussed rare but serious complications including epidural abscess, permanent nerve injury, epidural hematoma.  Discussed alternatives to epidural analgesia and patient desires to proceed.  Timeout performed pre-procedure verifying patient name, procedure, and platelet count.  Patient tolerated procedure well. Reason for block:procedure for pain

## 2016-07-24 ENCOUNTER — Encounter (HOSPITAL_COMMUNITY): Payer: Self-pay

## 2016-07-24 DIAGNOSIS — Z3A41 41 weeks gestation of pregnancy: Secondary | ICD-10-CM

## 2016-07-24 DIAGNOSIS — O480 Post-term pregnancy: Secondary | ICD-10-CM

## 2016-07-24 LAB — COMPREHENSIVE METABOLIC PANEL
ALT: 11 U/L — AB (ref 14–54)
AST: 17 U/L (ref 15–41)
Albumin: 2.7 g/dL — ABNORMAL LOW (ref 3.5–5.0)
Alkaline Phosphatase: 70 U/L (ref 38–126)
Anion gap: 9 (ref 5–15)
BILIRUBIN TOTAL: 0.2 mg/dL — AB (ref 0.3–1.2)
CHLORIDE: 102 mmol/L (ref 101–111)
CO2: 24 mmol/L (ref 22–32)
CREATININE: 0.57 mg/dL (ref 0.44–1.00)
Calcium: 8.6 mg/dL — ABNORMAL LOW (ref 8.9–10.3)
GFR calc Af Amer: >60 mL/min (ref >60)
Glucose, Bld: 97 mg/dL (ref 65–99)
Potassium: 3.9 mmol/L (ref 3.5–5.1)
Sodium: 135 mmol/L (ref 135–145)
TOTAL PROTEIN: 6.7 g/dL (ref 6.5–8.1)

## 2016-07-24 LAB — PROTEIN / CREATININE RATIO, URINE
CREATININE, URINE: 132 mg/dL
Protein Creatinine Ratio: 0.15 mg/mg{Cre} (ref 0.00–0.15)
Total Protein, Urine: 20 mg/dL

## 2016-07-24 LAB — CBC
HEMATOCRIT: 29.8 % — AB (ref 36.0–46.0)
HEMOGLOBIN: 9.8 g/dL — AB (ref 12.0–15.0)
MCH: 29.3 pg (ref 26.0–34.0)
MCHC: 32.9 g/dL (ref 30.0–36.0)
MCV: 89.2 fL (ref 78.0–100.0)
Platelets: 254 10*3/uL (ref 150–400)
RBC: 3.34 MIL/uL — AB (ref 3.87–5.11)
RDW: 13.7 % (ref 11.5–15.5)
WBC: 13.1 10*3/uL — AB (ref 4.0–10.5)

## 2016-07-24 MED ORDER — SODIUM BICARBONATE 8.4 % IV SOLN
INTRAVENOUS | Status: DC | PRN
  Administered 2016-07-24: 3 mL via EPIDURAL
  Administered 2016-07-24: 2 mL via EPIDURAL
  Administered 2016-07-24: 3 mL via EPIDURAL
  Administered 2016-07-24: 2 mL via EPIDURAL

## 2016-07-24 MED ORDER — ONDANSETRON HCL 4 MG PO TABS: 4.0000 mg | ORAL_TABLET | ORAL | Status: DC | PRN

## 2016-07-24 MED ORDER — ONDANSETRON HCL 4 MG/2ML IJ SOLN: 4.0000 mg | INTRAMUSCULAR | Status: DC | PRN

## 2016-07-24 MED ORDER — SODIUM CHLORIDE 0.9% FLUSH: 3.0000 mL | Freq: Two times a day (BID) | INTRAVENOUS | Status: DC

## 2016-07-24 MED ORDER — DIBUCAINE 1 % RE OINT: 1.0000 "application " | TOPICAL_OINTMENT | RECTAL | Status: DC | PRN

## 2016-07-24 MED ORDER — ZOLPIDEM TARTRATE 5 MG PO TABS: 5.0000 mg | ORAL_TABLET | Freq: Every evening | ORAL | Status: DC | PRN

## 2016-07-24 MED ORDER — TETANUS-DIPHTH-ACELL PERTUSSIS 5-2.5-18.5 LF-MCG/0.5 IM SUSP: 0.5000 mL | Freq: Once | INTRAMUSCULAR | Status: DC

## 2016-07-24 MED ORDER — BENZOCAINE-MENTHOL 20-0.5 % EX AERO
1.0000 "application " | INHALATION_SPRAY | CUTANEOUS | Status: DC | PRN
  Filled 2016-07-24: qty 56

## 2016-07-24 MED ORDER — WITCH HAZEL-GLYCERIN EX PADS: 1.0000 "application " | MEDICATED_PAD | CUTANEOUS | Status: DC | PRN

## 2016-07-24 MED ORDER — SIMETHICONE 80 MG PO CHEW: 80.0000 mg | CHEWABLE_TABLET | ORAL | Status: DC | PRN

## 2016-07-24 MED ORDER — SODIUM CHLORIDE 0.9% FLUSH: 3.0000 mL | INTRAVENOUS | Status: DC | PRN

## 2016-07-24 MED ORDER — ACETAMINOPHEN 325 MG PO TABS
650.0000 mg | ORAL_TABLET | ORAL | Status: DC | PRN
  Administered 2016-07-25 – 2016-07-26 (×4): 650 mg via ORAL
  Filled 2016-07-24 (×6): qty 2

## 2016-07-24 MED ORDER — SODIUM CHLORIDE 0.9 % IV SOLN: 250.0000 mL | INTRAVENOUS | Status: DC | PRN

## 2016-07-24 MED ORDER — LIDOCAINE-EPINEPHRINE (PF) 2 %-1:200000 IJ SOLN: INTRAMUSCULAR | Status: DC | PRN

## 2016-07-24 MED ORDER — DIPHENHYDRAMINE HCL 25 MG PO CAPS: 25.0000 mg | ORAL_CAPSULE | Freq: Four times a day (QID) | ORAL | Status: DC | PRN

## 2016-07-24 MED ORDER — IBUPROFEN 600 MG PO TABS
600.0000 mg | ORAL_TABLET | Freq: Four times a day (QID) | ORAL | Status: DC
  Administered 2016-07-24 – 2016-07-26 (×8): 600 mg via ORAL
  Filled 2016-07-24 (×8): qty 1

## 2016-07-24 MED ORDER — COCONUT OIL OIL: 1.0000 "application " | TOPICAL_OIL | Status: DC | PRN

## 2016-07-24 MED ORDER — SENNOSIDES-DOCUSATE SODIUM 8.6-50 MG PO TABS
2.0000 | ORAL_TABLET | ORAL | Status: DC
  Administered 2016-07-24 – 2016-07-26 (×2): 2 via ORAL
  Filled 2016-07-24 (×2): qty 2

## 2016-07-24 MED ORDER — OXYTOCIN 40 UNITS IN LACTATED RINGERS INFUSION - SIMPLE MED
2.5000 [IU]/h | INTRAVENOUS | Status: DC | PRN
Start: 2016-07-24 — End: 2016-07-26

## 2016-07-24 MED ORDER — PRENATAL MULTIVITAMIN CH
1.0000 | ORAL_TABLET | Freq: Every day | ORAL | Status: DC
  Administered 2016-07-25 – 2016-07-26 (×2): 1 via ORAL
  Filled 2016-07-24 (×2): qty 1

## 2016-07-24 NOTE — Progress Notes (Signed)
Patient ID: Alexandria Beltran, female   DOB: December 04, 1990, 26 y.o.   MRN: [id number]  S: Patient seen & examined for progress of labor. Patient comfortable with epidural.    O:  Vitals:   07/24/16 0800 07/24/16 0830 07/24/16 0900 07/24/16 0932  BP: 105/64 (!) 124/57 (!) 122/50 (!) 143/83  Pulse: 91 96 77 (!) 104  Resp: 20  18 18   Temp:    98.6 F (37 C)  TempSrc:    Oral  SpO2:      Weight:      Height:        Dilation: 5.5 Effacement (%): 50, 60 Cervical Position: Middle Station: -2 Presentation: Vertex Exam by:: F. Cresenzo-Dishmon, CNM  AROM performed, clear fluid returned   FHT: 135bpm, mod var, no accels, +variable decels but unable to determine contraction pattern TOCO: Unable to determine, IUPC placed   A/P: AROM - clear IUPC placed Continue pitocin pending contractions Continue expectant management Anticipate SVD

## 2016-07-24 NOTE — Lactation Note (Signed)
This note was copied from a baby's chart. Lactation Consultation Note  Patient Name: Alexandria Beltran ZOXWR'UToday's Date: 07/24/2016 Reason for consult: Initial assessment  Initial visit at 6 hours of age. Mom reports baby had a good feeding earlier and now baby has been sleepy.  Mom is able to hand express drops and apply to baby's mouth and baby is not waking up.  Baby remains STS with mom.  Lc advised as normal newborn behavior.  Mom to call for assist with next feeding for LATCH score.  [facility] resources given and discussed.  Encouraged to feed with early cues on demand.  Early newborn behavior discussed.  Hand expression demonstrated by mom with colostrum visible.  Mom to call for assist as needed.     Maternal Data Has patient been taught Hand Expression?: Yes Does the patient have breastfeeding experience prior to this delivery?: No  Feeding Feeding Type: Breast Fed  LATCH Score/Interventions Latch: Too sleepy or reluctant, no latch achieved, no sucking elicited.  Audible Swallowing: None  Type of Nipple: Flat (compressible)  Comfort (Breast/Nipple): Soft / non-tender     Hold (Positioning): Assistance needed to correctly position infant at breast and maintain latch. Intervention(s): Breastfeeding basics reviewed;Skin to skin  LATCH Score: 4  Lactation Tools Discussed/Used WIC Program: Yes   Consult Status Consult Status: Follow-up Date: 07/25/16 Follow-up type: In-patient    Ruchy Wildrick, Arvella MerlesJana Lynn 07/24/2016, 7:07 PM

## 2016-07-24 NOTE — Progress Notes (Signed)
Patient's labor progressing with pitocin. Foley out @1700 . Pain control with epidural.  Dilation: 5.5 Effacement (%): 50 Cervical Position: Middle Station: -2 Presentation: Vertex Exam by:: F. Cresenzo-Dishmon, CNM   FHT: 145 bpm, mod var, +accels, no decels TOCO: occasional   A/P: Labor: Progressing on pitocin (4312mu/m) Pain: controlled with epidural FWB: Category I tracing  Continue expectant management Anticipate SVD

## 2016-07-24 NOTE — Anesthesia Postprocedure Evaluation (Signed)
Anesthesia Post Note  Patient: Alexandria Beltran  Procedure(s) Performed: * No procedures listed *     Patient location during evaluation: Mother Baby Anesthesia Type: Epidural Level of consciousness: awake, awake and alert, oriented and patient cooperative Pain management: satisfactory to patient Vital Signs Assessment: post-procedure vital signs reviewed and stable Respiratory status: spontaneous breathing Cardiovascular status: stable Postop Assessment: no headache, no backache, epidural receding, no signs of nausea or vomiting, patient able to bend at knees and adequate PO intake Anesthetic complications: no    Last Vitals:  Vitals:   07/24/16 1422 07/24/16 1503  BP: 127/64 (!) 148/73  Pulse: (!) 109 (!) 104  Resp: 16 16  Temp: 36.8 C 36.8 C    Last Pain:  Vitals:   07/24/16 1503  TempSrc: Oral  PainSc:    Pain Goal: Patients Stated Pain Goal: 3 (07/23/16 1355)               Verline Kong J

## 2016-07-24 NOTE — Progress Notes (Signed)
Comfortable w/epidural. Ctx q 3-4 minutes, mild to moderate. Cx 5.5/thick-2.  Pitocin on 2618mu/min.  FHR Cat 1.  Plan AROM when cx thins out.  Continue to increase pitocin until starts showing some cervical change

## 2016-07-25 NOTE — Lactation Note (Signed)
This note was copied from a baby's chart. Lactation Consultation Note  Patient Name: Alexandria Valla LeaverSieda Midgett WUJWJ'XToday's Date: 07/25/2016 Reason for consult: Follow-up assessment Baby at 24 hr of life. Upon entry mom was trying to latch baby. Mom has large soft breast. She was using the #24 NS because she could not see the nipple without pulling up on the tissue which cause the areola to get tight. Showed mom how to use a blanket roll under the breast to lift the level of the nipple. Used pillows to support baby closer to the nipple. Showed mom how to use the "tea cup" hold on the areola to help baby latch. Baby was able to easily and comfortably latch without the NS. Discussed baby behavior, feeding frequency, baby belly size, voids, wt loss, breast changes, and nipple care. Mom is aware of lactation services and support group. She will offer the breast on demand and post pump as needed.    Maternal Data    Feeding Feeding Type: Breast Fed Length of feed: 20 min  LATCH Score/Interventions Latch: Grasps breast easily, tongue down, lips flanged, rhythmical sucking. Intervention(s): Skin to skin;Teach feeding cues  Audible Swallowing: A few with stimulation Intervention(s): Skin to skin;Hand expression Intervention(s): Alternate breast massage  Type of Nipple: Everted at rest and after stimulation Intervention(s): No intervention needed  Comfort (Breast/Nipple): Soft / non-tender     Hold (Positioning): Full assist, staff holds infant at breast Intervention(s): Position options;Support Pillows  LATCH Score: 7  Lactation Tools Discussed/Used     Consult Status Consult Status: Follow-up Date: 07/26/16 Follow-up type: In-patient    Alexandria Beltran 07/25/2016, 12:56 PM

## 2016-07-25 NOTE — Lactation Note (Addendum)
This note was copied from a baby's chart. Lactation Consultation Note  Baby 4734 hours old and mother having trouble sustaining latch. Mother attempting in football hold w/ difficulty sustaining latch.  Seems baby pushes off with her tongue. Mother's nipples are soft, do not sustain shape when latch but are compressible and seem to add to the challenge. Repositioned baby to cradle hold with roll under breast and pillow at diagonal.  Mother has good flow of colostrum w/ expression. Applied smaller #20NS and baby latched for a few minutes until she got into a pattern. When baby came back off, assisted mother w/ latching without NS. Baby was able to sustain latch in this position. Sucks and swallows observed. Recommend mother continue to post pump and give baby back volume pumped on spoon until she is able to sustain latch longer. Wrote steps on white board to remind mother how to latch baby. Recommend mother bf on both breasts per feeding. 20 min later baby was still breastfeeding on R side without NS. Praised mother for her efforts.    Patient Name: Girl Valla LeaverSieda Slaven Today's Date: 07/25/2016     Maternal Data    Feeding Feeding Type: Breast Fed  LATCH Score/Interventions Latch: Repeated attempts needed to sustain latch, nipple held in mouth throughout feeding, stimulation needed to elicit sucking reflex.  Audible Swallowing: A few with stimulation Intervention(s): Skin to skin;Hand expression  Type of Nipple: Flat  Comfort (Breast/Nipple): Soft / non-tender     Hold (Positioning): Assistance needed to correctly position infant at breast and maintain latch.  LATCH Score: 6  Lactation Tools Discussed/Used     Consult Status      Dahlia ByesBerkelhammer, Taronda Comacho [facility] 07/25/2016, 10:55 PM

## 2016-07-25 NOTE — Progress Notes (Signed)
CSW attempted to meet with MOB to complete assessment; however, upon this writers arrival, MOB was asleep and baby was being held by a visitor. CSW will attempt to see MOB at a later time.   Aaryanna Hyden, MSW, LCSW-A Clinical Social Worker  [facility]  Office: [phone]

## 2016-07-25 NOTE — Lactation Note (Signed)
This note was copied from a baby's chart. Lactation Consultation Note  Patient Name: Alexandria Beltran [id number] Date: 07/25/2016 Reason for consult: Follow-up assessment Infant is 7 hours old and seen by lactation for follow-up assessment. RN had asked LC to help mom with latch. Baby last breastfed ~1:30pm for 10 mins and was now showing feeding cues. Baby was skin-to-skin with mom when Alexandria Beltran entered. Mom has large soft breasts. Alexandria Beltran, IBCLC had worked with mom earlier (see her note from 12:56pm). Tried latching baby on left breast in football hold with towel rolled up under her breast and baby would open wide and suck a few times but could not maintain a latch. Mom's nipple flattens with compression. Mom was able to hand express a few drops from her left breast. Encouraged mom to do hand expressing into a spoon so we could spoon feed the baby but mom was not able to get many drops so I fed what she did get to the baby via gloved finger. Suggested mom tried laid back BF and so mom did on left breast and baby bobbed around and suckled at the nipple but again could not maintain a latch. Suggested mom try with the #24 NS and baby latched and did some good strong sucks but then fell asleep and would not wake up. Mom had asked about pumping so went to get pump kit. Showed mom how to pump on initiate setting & how to clean pump parts. Mom pumped and multiple drops were on the breast flange (used size 27) - mom finger fed this to baby via gloved finger but baby still would not wake up. Encouraged mom to put baby skin-to-skin and if she didn't wake to feed in the next 15-20 mins, they should try changing her diaper to wake her to get her to feed. Mom encouraged to feed baby 8-12 times/24 hours and with feeding cues. And to wake baby if it has been ~3-3.5hrs since last feeding and then for mom to post pump and dad could give pumped milk. This LC questions mom's receptiveness to BF/ pumping plan. Mom reports no questions  at this time. Encouraged mom to ask for help at next feeding if needed.  Discussed plan with RN and due to mom's challenging breast tissue for latching, discussed that they may need to supplement via spoon or syringe if baby does not latch well this evening.   Maternal Data    Feeding    LATCH Score/Interventions                      Lactation Tools Discussed/Used WIC Program: Yes Pump Review: Setup, frequency, and cleaning   Consult Status Consult Status: Follow-up Date: 07/26/16 Follow-up type: In-patient    Alexandria Beltran 07/25/2016, 5:30 PM
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
## 2016-07-26 MED ORDER — IBUPROFEN 600 MG PO TABS: 600.0000 mg | ORAL_TABLET | Freq: Four times a day (QID) | ORAL | 0 refills | Status: DC

## 2016-07-26 NOTE — Progress Notes (Signed)
CSW attempted to meet with MOB at bedside for the second time; however, she was being seen by lactation. Lactation RN request this Clinical research associatewriter come back at a later time.   Joevanni Roddey, MSW, LCSW-A Clinical Social Worker  [facility]  Office: [phone]

## 2016-07-26 NOTE — Lactation Note (Signed)
This note was copied from a baby's chart. Lactation Consultation Note  Patient Name: Girl Valla LeaverSieda Santistevan ZOXWR'UToday's Date: 07/26/2016 Reason for consult: Follow-up assessment;Difficult latch Mom had pumped 5 ml of colostrum from 1 breast. Demonstrated how to finger feed this back to baby using curved tipped syringe. Mom latched baby to right breast in cradle using 20 nipple shield with some assist. Baby nursed for approx 10 minutes then came off the breast fussy, colostrum present in nipple shield. Assisted Mom with re-latching baby in football hold. Baby demonstrated good suckling bursts.  After an additional 5 minutes in football hold, no colostrum visible in nipple shield. Baby sleepy and had come off the breast. Encouraged Mom to post pump to see if she will receive more EBM to supplement. Peds has encouraged supplementing with EBM till baby latching consistently well at breast, using formula as needed due to bili level increasing and now in high/intermediate zone per graphing. Supplemental guidelines reviewed with Mom and advised would like to have 15-18 ml to supplement per guidelines per hours of age.   Baby does appear to have short labial frenulum. Possible short posterior lingual frenulum.   Plan for now is to BF with each feeding, 8-12 times in 24 hours. Try to keep baby nursing for 15-30 minutes, both breasts some feedings. Use 20 nipple shield to latch. Look for breast milk in nipple shield.  Mom to post pump for 15 minutes and give baby back EBM received per above note, forumula if needed. Mom to call for assist with next feeding and for assist with giving supplement via curved tipped syringe or finger feeding. RN aware.  Maternal Data    Feeding Feeding Type: Breast Fed Length of feed: 20 min  LATCH Score/Interventions Latch: Grasps breast easily, tongue down, lips flanged, rhythmical sucking. (using 20 nipple shield) Intervention(s): Assist with latch;Adjust position  Audible Swallowing: A  few with stimulation  Type of Nipple: Everted at rest and after stimulation (flatten w/breast compression to latch) Intervention(s): Double electric pump  Comfort (Breast/Nipple): Soft / non-tender     Hold (Positioning): Assistance needed to correctly position infant at breast and maintain latch. Intervention(s): Breastfeeding basics reviewed;Support Pillows;Position options;Skin to skin  LATCH Score: 8  Lactation Tools Discussed/Used Tools: Pump;Nipple Shields Nipple shield size: 20 Breast pump type: Double-Electric Breast Pump   Consult Status Consult Status: Follow-up Date: 07/26/16 Follow-up type: In-patient    Alfred LevinsGranger, Jodean Valade Ann 07/26/2016, 11:47 AM

## 2016-07-26 NOTE — Lactation Note (Signed)
This note was copied from a baby's chart. Lactation Consultation Note  Patient Name: [name] ZOXWR'UToday's Date: 07/26/2016 Reason for consult: Follow-up assessment;Difficult latch Attempted to latch baby without nipple shield initially but at this visit baby could not latch. Mom reports baby did latch at the last feeding without nipple shield. Mom's nipples will become erect with stimulation but flatten again w/latch. Baby became frustrated at this visit trying to latch without the nipple shield. Use 20 nipple shield and baby was able to latch and sustain the latch demonstrating some good suckling bursts. Colostrum present in nipple shield at the end of the feeding. Encouraged Mom to post pump for 15 minutes to encourage milk production. Advised Mom baby should be at breast 8-12 times in 24 hours and with feeding ques. Keep baby nursing for 15-30 minutes both breasts some feedings. Mom getting more independent with latch but LC feels Mom would benefit from further assist. LC left phone number for Mom to call with next feeding. WIC referral completed. OP f/u with Lactation scheduled for Wednesday 07/29/16 at 3:30.   Maternal Data    Feeding Feeding Type: Breast Fed Length of feed: 30 min  LATCH Score/Interventions Latch: Repeated attempts needed to sustain latch, nipple held in mouth throughout feeding, stimulation needed to elicit sucking reflex. (using 20 nipple shield) Intervention(s): Adjust position;Assist with latch;Breast massage  Audible Swallowing: A few with stimulation  Type of Nipple: Everted at rest and after stimulation (flatten slightly w/latch) Intervention(s): Double electric pump  Comfort (Breast/Nipple): Soft / non-tender     Hold (Positioning): Assistance needed to correctly position infant at breast and maintain latch.  LATCH Score: 7  Lactation Tools Discussed/Used Tools: Nipple Dorris CarnesShields;Pump Nipple shield size: 20 Breast pump type: Double-Electric Breast  Pump   Consult Status Consult Status: Follow-up Date: 07/26/16 Follow-up type: In-patient    Alfred LevinsGranger, Yuridia Couts Ann 07/26/2016, 9:50 AM

## 2016-07-26 NOTE — Discharge Summary (Signed)
OB Discharge Summary  Patient Name: Alexandria Beltran DOB: 1991-01-04 MRN: [id number]  Date of admission: 07/23/2016 Delivering MD: Jen MowMUMAW, [facility]   Date of discharge: 07/26/2016  Admitting diagnosis: INDUCTION Intrauterine pregnancy: 5048w3d     Secondary diagnosis:Active Problems:   Post term pregnancy at [redacted] weeks gestation  Additional problems:non3     Discharge diagnosis: Term Pregnancy Delivered                                                                     Post partum procedures:n/a  Augmentation: n/a  Complications: None  Hospital course:  Induction of Labor With Vaginal Delivery   26 y.o. yo G1P1001 at 6548w3d was admitted to the hospital 07/23/2016 for induction of labor.  Indication for induction: Postdates.  Patient had an uncomplicated labor course as follows: Membrane Rupture Time/Date: 9:39 AM ,07/24/2016   Intrapartum Procedures: Episiotomy: None [1]                                         Lacerations:  Vaginal [6]  Patient had delivery of a Viable infant.  Information for the patient's newborn:  Ludwig ClarksMurray, Girl Summerlyn [[id number]][[id number]]      07/24/2016  Details of delivery can be found in separate delivery note.  Patient had a routine postpartum course. Patient is discharged home 07/26/16.  Physical exam  Vitals:   07/24/16 1900 07/25/16 0524 07/25/16 1754 07/26/16 0536  BP: 128/71 (!) 115/59 (!) 112/56 123/79  Pulse: (!) 106 73 76 83  Resp: 18 16 17 18   Temp: 98.6 F (37 C) 97.3 F (36.3 C) 98.1 F (36.7 C) 98.2 F (36.8 C)  TempSrc: Oral Oral Oral Oral  SpO2:   100%   Weight:      Height:       General: alert, cooperative and no distress Lochia: appropriate Uterine Fundus: firm Incision: N/A DVT Evaluation: No evidence of DVT seen on physical exam. Labs: Lab Results  Component Value Date   WBC 13.1 (H) 07/24/2016   HGB 9.8 (L) 07/24/2016   HCT 29.8 (L) 07/24/2016   MCV 89.2 07/24/2016   PLT 254 07/24/2016   CMP Latest Ref Rng & Units  07/24/2016  Glucose 65 - 99 mg/dL 97  BUN 6 - 20 mg/dL <2(Z<5(L)  Creatinine 3.660.44 - 1.00 mg/dL 4.400.57  Sodium 347135 - 425145 mmol/L 135  Potassium 3.5 - 5.1 mmol/L 3.9  Chloride 101 - 111 mmol/L 102  CO2 22 - 32 mmol/L 24  Calcium 8.9 - 10.3 mg/dL 9.5(G8.6(L)  Total Protein 6.5 - 8.1 g/dL 6.7  Total Bilirubin 0.3 - 1.2 mg/dL 3.8(V0.2(L)  Alkaline Phos 38 - 126 U/L 70  AST 15 - 41 U/L 17  ALT 14 - 54 U/L 11(L)    Discharge instruction: per After Visit Summary and "Baby and Me Booklet".  After Visit Meds:  Allergies as of 07/26/2016   No Known Allergies     Medication List    TAKE these medications   ibuprofen 600 MG tablet Commonly known as:  ADVIL,MOTRIN Take 1 tablet (600 mg total) by mouth every 6 (six) hours.   PREPLUS  27-1 MG Tabs Take 1 tablet by mouth daily.       Diet: routine diet  Activity: Advance as tolerated. Pelvic rest for 6 weeks.   Outpatient follow up:6 weeks Follow up Appt:No future appointments. Follow up visit: No Follow-up on file.  Postpartum contraception: Undecided  Newborn Data: Live born female  Birth Weight: 7 lb 3.3 oz (3270 g) APGAR: 8, 9  Baby Feeding: Bottle and Breast Disposition:home with mother   07/26/2016 Wyvonnia Dusky, CNM
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
## 2016-07-28 ENCOUNTER — Ambulatory Visit: Payer: Self-pay

## 2016-07-28 NOTE — Lactation Note (Signed)
This note was copied from a baby's chart. Lactation Consultation Note  Patient Name: Girl Valla LeaverSieda Sackmann [id number]'s Date: 07/28/2016   Parents & infant were leaving room for discharge as I walked towards their room. RN reports that mom said she "may try" breastfeeding when she gets home. RN did provide her with some regular Similac (instead of Alimentum).   Lurline HareRichey, Riann Oman [facility] 07/28/2016, 11:17 AM

## 2017-01-21 LAB — OB RESULTS CONSOLE GC/CHLAMYDIA
Chlamydia: NEGATIVE
Gonorrhea: NEGATIVE

## 2017-01-21 LAB — OB RESULTS CONSOLE HIV ANTIBODY (ROUTINE TESTING): HIV: NONREACTIVE

## 2017-01-21 LAB — OB RESULTS CONSOLE HEPATITIS B SURFACE ANTIGEN: HEP B S AG: NEGATIVE

## 2017-01-21 LAB — OB RESULTS CONSOLE RPR: RPR: NONREACTIVE

## 2017-01-21 LAB — OB RESULTS CONSOLE PLATELET COUNT: PLATELETS: 365

## 2017-01-21 LAB — OB RESULTS CONSOLE HGB/HCT, BLOOD
HEMATOCRIT: 32
HEMOGLOBIN: 10.4

## 2017-01-21 LAB — OB RESULTS CONSOLE RUBELLA ANTIBODY, IGM: Rubella: UNDETERMINED

## 2017-01-28 ENCOUNTER — Other Ambulatory Visit: Payer: Self-pay

## 2017-02-02 ENCOUNTER — Other Ambulatory Visit: Payer: Self-pay

## 2017-02-02 ENCOUNTER — Encounter: Payer: Self-pay | Admitting: *Deleted

## 2017-02-04 ENCOUNTER — Ambulatory Visit: Payer: Medicaid Other | Admitting: *Deleted

## 2017-02-04 ENCOUNTER — Encounter: Payer: Medicaid Other | Attending: Family Medicine | Admitting: *Deleted

## 2017-02-04 ENCOUNTER — Other Ambulatory Visit: Payer: Self-pay | Admitting: *Deleted

## 2017-02-04 DIAGNOSIS — R7302 Impaired glucose tolerance (oral): Secondary | ICD-10-CM | POA: Insufficient documentation

## 2017-02-04 DIAGNOSIS — R7309 Other abnormal glucose: Secondary | ICD-10-CM

## 2017-02-04 MED ORDER — ACCU-CHEK FASTCLIX LANCETS MISC: 1.0000 | Freq: Four times a day (QID) | 10 refills | Status: DC

## 2017-02-04 MED ORDER — ACCU-CHEK GUIDE W/DEVICE KIT: 1.0000 | PACK | Freq: Once | 0 refills | Status: AC

## 2017-02-04 MED ORDER — GLUCOSE BLOOD VI STRP: 1.0000 | ORAL_STRIP | Freq: Four times a day (QID) | 12 refills | Status: DC

## 2017-02-04 NOTE — Progress Notes (Addendum)
  Patient was seen on 02/04/2017 for Gestational Diabetes self-management . EDD 07/05/2017. She states no history of GDM with last pregnancy less than 1 year ago. She does have family history of DM 2. Diet history reveals healthy food choices with good variety from all food groups. No sweetened beverages consumed anymore. The following learning objectives were met by the patient :   States the definition of Gestational Diabetes  States why dietary management is important in controlling blood glucose  Describes the effects of carbohydrates on blood glucose levels  Demonstrates ability to create a balanced meal plan  Demonstrates carbohydrate counting   States when to check blood glucose levels  Demonstrates proper blood glucose monitoring techniques  States the effect of stress and exercise on blood glucose levels  States the importance of limiting caffeine and abstaining from alcohol and smoking  Plan:  Aim for 3 Carb Choices per meal (45 grams) +/- 1 either way  Aim for 1-2 Carbs per snack Begin reading food labels for Total Carbohydrate of foods Consider  increasing your activity level by walking or other activity daily as tolerated Begin checking BG before breakfast and 2 hours after first bite of breakfast, lunch and dinner as directed by MD  Bring Log Book to every medical appointment   Take medication if directed by MD  Blood glucose monitor Rx called into pharmacy: Accu Check Guide with Fast Clix drums Patient instructed to test pre breakfast and 2 hours each meal as directed by MD  Patient instructed to monitor glucose levels: FBS: 60 - 95 mg/dl 2 hour: <120 mg/dl  Patient received the following handouts:  Nutrition Diabetes and Pregnancy  Carbohydrate Counting List  Patient will be seen for follow-up as needed.

## 2017-02-09 LAB — OB RESULTS CONSOLE VARICELLA ZOSTER ANTIBODY, IGG: VARICELLA IGG: IMMUNE

## 2017-02-17 ENCOUNTER — Encounter: Payer: Self-pay | Admitting: Obstetrics & Gynecology

## 2017-02-23 NOTE — L&D Delivery Note (Signed)
Delivery Note At 7:51 PM a viable and healthy female was delivered via Vaginal, Spontaneous (Presentation: cephalic; OA  ).  APGAR: 8, 9; Placenta status: spontaneous, intact .  Cord: 3 vessel Complications: none  Anesthesia:  epidural Episiotomy: None Lacerations: 1st degree;Perineal Suture Repair: 3.0 monocryl Est. Blood Loss (mL):  200  Mom to postpartum.  Baby to Couplet care / Skin to Skin.  Myrene Buddy 06/25/2017, 8:33 PM

## 2017-02-25 LAB — OB RESULTS CONSOLE GC/CHLAMYDIA
CHLAMYDIA, DNA PROBE: NEGATIVE
Gonorrhea: NEGATIVE

## 2017-02-25 LAB — OB RESULTS CONSOLE VARICELLA ZOSTER ANTIBODY, IGG: VARICELLA IGG: IMMUNE

## 2017-02-25 LAB — OB RESULTS CONSOLE HGB/HCT, BLOOD
HCT: 34
HEMOGLOBIN: 11

## 2017-02-25 LAB — OB RESULTS CONSOLE RPR: RPR: NONREACTIVE

## 2017-02-25 LAB — CULTURE, OB URINE: Urine Culture, OB: NO GROWTH

## 2017-02-25 LAB — OB RESULTS CONSOLE PLATELET COUNT: Platelets: 186

## 2017-03-22 ENCOUNTER — Encounter: Payer: Medicaid Other | Admitting: Obstetrics & Gynecology

## 2017-03-22 ENCOUNTER — Telehealth: Payer: Self-pay | Admitting: Obstetrics & Gynecology

## 2017-03-22 NOTE — Telephone Encounter (Signed)
Called patient about missing her appointment. Patient stated she had no way to get to her appointments here. She stated she could walk to HD. I informed patient about Medicaid Transportation, and she said she would call her case worker. She also stated she was going to call HD to see if they would be willing to see her until she could get help with getting here, and it was just too hard for her to try to come here right now.

## 2017-03-31 ENCOUNTER — Ambulatory Visit (INDEPENDENT_AMBULATORY_CARE_PROVIDER_SITE_OTHER): Payer: Medicaid Other | Admitting: Family Medicine

## 2017-03-31 ENCOUNTER — Other Ambulatory Visit: Payer: Self-pay | Admitting: Family Medicine

## 2017-03-31 ENCOUNTER — Encounter: Payer: Self-pay | Admitting: Family Medicine

## 2017-03-31 VITALS — BP 126/65 | HR 94 | Temp 98.1°F | Resp 20 | Ht 72.0 in | Wt 291.0 lb

## 2017-03-31 DIAGNOSIS — O24419 Gestational diabetes mellitus in pregnancy, unspecified control: Secondary | ICD-10-CM

## 2017-03-31 DIAGNOSIS — J4520 Mild intermittent asthma, uncomplicated: Secondary | ICD-10-CM

## 2017-03-31 DIAGNOSIS — G44209 Tension-type headache, unspecified, not intractable: Secondary | ICD-10-CM | POA: Diagnosis not present

## 2017-03-31 DIAGNOSIS — Z3492 Encounter for supervision of normal pregnancy, unspecified, second trimester: Secondary | ICD-10-CM

## 2017-03-31 LAB — POCT URINALYSIS DIP (DEVICE)
BILIRUBIN URINE: NEGATIVE
Glucose, UA: NEGATIVE mg/dL
HGB URINE DIPSTICK: NEGATIVE
Ketones, ur: NEGATIVE mg/dL
Leukocytes, UA: NEGATIVE
Nitrite: NEGATIVE
PH: 6.5 (ref 5.0–8.0)
PROTEIN: 30 mg/dL — AB
Specific Gravity, Urine: 1.025 (ref 1.005–1.030)
Urobilinogen, UA: 1 mg/dL (ref 0.0–1.0)

## 2017-03-31 MED ORDER — ACETAMINOPHEN 500 MG PO TABS: 500.0000 mg | ORAL_TABLET | Freq: Four times a day (QID) | ORAL | 0 refills | Status: DC | PRN

## 2017-03-31 MED ORDER — MONTELUKAST SODIUM 10 MG PO TABS: 10.0000 mg | ORAL_TABLET | Freq: Every day | ORAL | 3 refills | Status: DC

## 2017-03-31 NOTE — Progress Notes (Signed)
Subjective:    Patient ID: Alexandria Beltran, female    DOB: [birth year], 27 y.o.   MRN: [id number]  HPI Chief Complaint  Patient presents with  . Establish Care    having to use inhaler frequently for shortness of breath   Alexandria Beltran, a  27 year old female with a history of asthma presents to establish care.Patient says that she is [redacted] weeks pregnant. She says that she has not been receiving consistent prenatal care. Patient [facility] during this [facility]. Patient has connected with the [facility] program for healthy foods and has been taking prenatal vitamins. She says that she does not have an obstetrician. Alexandria Beltran was recently pregnant, and had a post term birth on 07/24/2016. She says that current pregnancy was not planned. She has periodic nausea, but overall feels well. She does not eat a balanced diet and has been diagnosed with gestational diabetes. Patient is not on anti-diabetic medications.   Alexandria Beltran is also complaining of asthma. She has been using albuterol frequently over the past several weeks. She endorses shortness of breath, wheezing, and coughing.Medications used in the past to treat these symptoms include beta agonist inhalers and inhaled steroids. Suspected precipitants include pregnancy.  Patient has not required Emergency Room treatment for these symptoms, and has not required hospitalization.  Current Outpatient Medications on File Prior to Visit  Medication Sig Dispense Refill  . ACCU-CHEK FASTCLIX LANCETS MISC 1 each by Does not apply route 4 (four) times daily. 100 each 10  . albuterol (PROVENTIL HFA;VENTOLIN HFA) 108 (90 Base) MCG/ACT inhaler Inhale 2 puffs into the lungs every 6 (six) hours as needed for wheezing or shortness of breath.    Marland Kitchen. glucose blood (ACCU-CHEK GUIDE) test strip 1 each by Other route 4 (four) times daily. Use as instructed 100 each 12  . ibuprofen (ADVIL,MOTRIN) 600 MG tablet  Take 1 tablet (600 mg total) by mouth every 6 (six) hours. 30 tablet 0  . Prenatal Vit-Fe Fumarate-FA (PREPLUS) 27-1 MG TABS Take 1 tablet by mouth daily.  0   No current facility-administered medications on file prior to visit.   There is no immunization history for the selected administration types on file for this patient.Will review [facility], She says that all vaccinations are up to date.  No Known Allergies  Review of Systems  Constitutional: Negative.   HENT: Negative.   Eyes: Negative.   Respiratory: Positive for cough and shortness of breath.   Cardiovascular: Negative.   Gastrointestinal: Negative.   Endocrine: Negative for polydipsia, polyphagia and polyuria.  Genitourinary: Negative.   Musculoskeletal: Negative.   Neurological: Negative.   Hematological: Negative.   Psychiatric/Behavioral: Negative for agitation.       Objective:   Physical Exam  Constitutional: She is oriented to person, place, and time. She appears well-developed.  HENT:  Head: Normocephalic.  Right Ear: External ear normal.  Mouth/Throat: Oropharynx is clear and moist.  Eyes: Pupils are equal, round, and reactive to light.  Neck: Normal range of motion. Neck supple.  Cardiovascular: Normal rate, regular rhythm, normal heart sounds and intact distal pulses.  Pulmonary/Chest: Effort normal and breath sounds normal. No tachypnea. She has no decreased breath sounds. She has no wheezes. She has no rhonchi. She has no rales.  Abdominal: Soft. Bowel sounds are normal.  Neurological: She is alert and oriented to person, place, and time.  Skin: Skin is warm and dry.  Psychiatric: She has  a normal mood and affect. Her behavior is normal. Judgment and thought content normal.      BP 126/65 (BP Location: Left Arm, Patient Position: Sitting, Cuff Size: Large)   Pulse 94   Temp 98.1 F (36.7 C)   Resp 20   Ht 6' (1.829 m)   Wt 291 lb (132 kg)   LMP 09/25/2016   SpO2 98%   BMI 39.47 kg/m  Assessment &  Plan:  1. Gestational diabetes mellitus (GDM) in second trimester, gestational diabetes method of control unspecified Will defer to obstetrician for further treatment and evaluation - Urinalysis, Routine w reflex microscopic - POCT urinalysis dip (device)  2. Second trimester pregnancy - Ambulatory referral to Obstetrics / Gynecology - POCT urinalysis dip (device)  3. Acute non intractable tension-type headache - acetaminophen (TYLENOL) 500 MG tablet; Take 1 tablet (500 mg total) by mouth every 6 (six) hours as needed.  Dispense: 30 tablet; Refill: 0  4. Mild intermittent asthma without complication - montelukast (SINGULAIR) 10 MG tablet; Take 1 tablet (10 mg total) by mouth at bedtime.  Dispense: 30 tablet; Refill: 3   RTC: Follow up in 6 months for CPE   Nolon Nations  MSN, FNP-C [facility] [address] [place], [place] [address] [phone]    The patient was given clear instructions to go to ER or return to medical center if symptoms do not improve, worsen or new problems develop. The patient verbalized understanding.

## 2017-03-31 NOTE — Patient Instructions (Signed)
Patient has mild intermittent asthma with increased exacerbations since second trimester pregnancy.  Albuterol is a pregnancy category C and she has been using increased doses of albuterol, will send an urgent referral to pulmonology for further workup and evaluation.  Will start a trial of Singulair to help with symptoms.  Singulair is a pregnancy category B, so it safe to use during pregnancy.  Recommend the patient continues prenatal vitamin.  We will schedule follow-up following pregnancy   Second Trimester of Pregnancy The second trimester is from week 13 through week 28, month 4 through 6. This is often the time in pregnancy that you feel your best. Often times, morning sickness has lessened or quit. You may have more energy, and you may get hungry more often. Your unborn baby (fetus) is growing rapidly. At the end of the sixth month, he or she is about 9 inches long and weighs about 1 pounds. You will likely feel the baby move (quickening) between 18 and 20 weeks of pregnancy. Follow these instructions at home:  Avoid all smoking, herbs, and alcohol. Avoid drugs not approved by your doctor.  Do not use any tobacco products, including cigarettes, chewing tobacco, and electronic cigarettes. If you need help quitting, ask your doctor. You may get counseling or other support to help you quit.  Only take medicine as told by your doctor. Some medicines are safe and some are not during pregnancy.  Exercise only as told by your doctor. Stop exercising if you start having cramps.  Eat regular, healthy meals.  Wear a good support bra if your breasts are tender.  Do not use hot tubs, steam rooms, or saunas.  Wear your seat belt when driving.  Avoid raw meat, uncooked cheese, and liter boxes and soil used by cats.  Take your prenatal vitamins.  Take 1500-2000 milligrams of calcium daily starting at the 20th week of pregnancy until you deliver your baby.  Try taking medicine that helps you  poop (stool softener) as needed, and if your doctor approves. Eat more fiber by eating fresh fruit, vegetables, and whole grains. Drink enough fluids to keep your pee (urine) clear or pale yellow.  Take warm water baths (sitz baths) to soothe pain or discomfort caused by hemorrhoids. Use hemorrhoid cream if your doctor approves.  If you have puffy, bulging veins (varicose veins), wear support hose. Raise (elevate) your feet for 15 minutes, 3-4 times a day. Limit salt in your diet.  Avoid heavy lifting, wear low heals, and sit up straight.  Rest with your legs raised if you have leg cramps or low back pain.  Visit your dentist if you have not gone during your pregnancy. Use a soft toothbrush to brush your teeth. Be gentle when you floss.  You can have sex (intercourse) unless your doctor tells you not to.  Go to your doctor visits. Get help if:  You feel dizzy.  You have mild cramps or pressure in your lower belly (abdomen).  You have a nagging pain in your belly area.  You continue to feel sick to your stomach (nauseous), throw up (vomit), or have watery poop (diarrhea).  You have bad smelling fluid coming from your vagina.  You have pain with peeing (urination). Get help right away if:  You have a fever.  You are leaking fluid from your vagina.  You have spotting or bleeding from your vagina.  You have severe belly cramping or pain.  You lose or gain weight rapidly.  You have  trouble catching your breath and have chest pain.  You notice sudden or extreme puffiness (swelling) of your face, hands, ankles, feet, or legs.  You have not felt the baby move in over an hour.  You have severe headaches that do not go away with medicine.  You have vision changes. This information is not intended to replace advice given to you by your health care provider. Make sure you discuss any questions you have with your health care provider. Document Released: 05/06/2009 Document  Revised: 07/18/2015 Document Reviewed: 04/12/2012 Elsevier Interactive Patient Education  2017 [place]. Common Medications Safe in Pregnancy  Acne:      Constipation:  Benzoyl Peroxide     Colace  Clindamycin      Dulcolax Suppository  Topica Erythromycin     Fibercon  Salicylic Acid      Metamucil         Miralax AVOID:        Senakot   Accutane    Cough:  Retin-A       Cough Drops  Tetracycline      Phenergan w/ Codeine if Rx  Minocycline      Robitussin (Plain & DM)  Antibiotics:     Crabs/Lice:  Ceclor       RID  Cephalosporins    AVOID:  E-Mycins      Kwell  Keflex  Macrobid/Macrodantin   Diarrhea:  Penicillin      Kao-Pectate  Zithromax      Imodium AD         PUSH FLUIDS AVOID:       Cipro     Fever:  Tetracycline      Tylenol (Regular or Extra  Minocycline       Strength)  Levaquin      Extra Strength-Do not          Exceed 8 tabs/24 hrs Caffeine:        <221m/day (equiv. To 1 cup of coffee or  approx. 3 12 oz sodas)         Gas: Cold/Hayfever:       Gas-X  Benadryl      Mylicon  Claritin       Phazyme  **Claritin-D        Chlor-Trimeton    Headaches:  Dimetapp      ASA-Free Excedrin  Drixoral-Non-Drowsy     Cold Compress  Mucinex (Guaifenasin)     Tylenol (Regular or Extra  Sudafed/Sudafed-12 Hour     Strength)  **Sudafed PE Pseudoephedrine   Tylenol Cold & Sinus     Vicks Vapor Rub  Zyrtec  **AVOID if Problems With Blood Pressure         Heartburn: Avoid lying down for at least 1 hour after meals  Aciphex      Maalox     Rash:  Milk of Magnesia     Benadryl    Mylanta       1% Hydrocortisone Cream  Pepcid  Pepcid Complete   Sleep Aids:  Prevacid      Ambien   Prilosec       Benadryl  Rolaids       Chamomile Tea  Tums (Limit 4/day)     Unisom  Zantac       Tylenol PM         Warm milk-add vanilla or  Hemorrhoids:       Sugar for taste  Anusol/Anusol H.C.  (RX: Analapram 2.5%)  Sugar Substitutes:  Hydrocortisone OTC     Ok in  moderation  Preparation H      Tucks        Vaseline lotion applied to tissue with wiping    Herpes:     Throat:  Acyclovir      Oragel  Famvir  Valtrex     Vaccines:         Flu Shot Leg Cramps:       *Gardasil  Benadryl      Hepatitis A         Hepatitis B Nasal Spray:       Pneumovax  Saline Nasal Spray     Polio Booster         Tetanus Nausea:       Tuberculosis test or PPD  Vitamin B6 25 mg TID   AVOID:    Dramamine      *Gardasil  Emetrol       Live Poliovirus  Ginger Root 250 mg QID    MMR (measles, mumps &  High Complex Carbs @ Bedtime    rebella)  Sea Bands-Accupressure    Varicella (Chickenpox)  Unisom 1/2 tab TID     *No known complications           If received before Pain:         Known pregnancy;   Darvocet       Resume series after  Lortab        Delivery  Percocet    Yeast:   Tramadol      Femstat  Tylenol 3      Gyne-lotrimin  Ultram       Monistat  Vicodin           MISC:         All Sunscreens           Hair Coloring/highlights          Insect Repellant's          (Including DEET)         Mystic Tans

## 2017-04-01 LAB — URINALYSIS, ROUTINE W REFLEX MICROSCOPIC
Bilirubin, UA: NEGATIVE
GLUCOSE, UA: NEGATIVE
Ketones, UA: NEGATIVE
Leukocytes, UA: NEGATIVE
NITRITE UA: NEGATIVE
PROTEIN UA: NEGATIVE
RBC, UA: NEGATIVE
SPEC GRAV UA: 1.023 (ref 1.005–1.030)
Urobilinogen, Ur: 1 mg/dL (ref 0.2–1.0)
pH, UA: 6.5 (ref 5.0–7.5)

## 2017-04-05 ENCOUNTER — Ambulatory Visit (INDEPENDENT_AMBULATORY_CARE_PROVIDER_SITE_OTHER): Payer: Medicaid Other | Admitting: Family Medicine

## 2017-04-05 ENCOUNTER — Encounter: Payer: Self-pay | Admitting: Family Medicine

## 2017-04-05 DIAGNOSIS — O24419 Gestational diabetes mellitus in pregnancy, unspecified control: Secondary | ICD-10-CM | POA: Diagnosis not present

## 2017-04-05 DIAGNOSIS — O0990 Supervision of high risk pregnancy, unspecified, unspecified trimester: Secondary | ICD-10-CM | POA: Insufficient documentation

## 2017-04-05 DIAGNOSIS — J45909 Unspecified asthma, uncomplicated: Secondary | ICD-10-CM

## 2017-04-05 DIAGNOSIS — O0991 Supervision of high risk pregnancy, unspecified, first trimester: Secondary | ICD-10-CM

## 2017-04-05 DIAGNOSIS — O99513 Diseases of the respiratory system complicating pregnancy, third trimester: Secondary | ICD-10-CM

## 2017-04-05 DIAGNOSIS — J4520 Mild intermittent asthma, uncomplicated: Secondary | ICD-10-CM | POA: Diagnosis not present

## 2017-04-05 DIAGNOSIS — O99511 Diseases of the respiratory system complicating pregnancy, first trimester: Secondary | ICD-10-CM

## 2017-04-05 LAB — POCT URINALYSIS DIP (DEVICE)
BILIRUBIN URINE: NEGATIVE
Glucose, UA: NEGATIVE mg/dL
Hgb urine dipstick: NEGATIVE
Ketones, ur: NEGATIVE mg/dL
LEUKOCYTES UA: NEGATIVE
NITRITE: NEGATIVE
PH: 6 (ref 5.0–8.0)
Protein, ur: NEGATIVE mg/dL
SPECIFIC GRAVITY, URINE: 1.02 (ref 1.005–1.030)
Urobilinogen, UA: 0.2 mg/dL (ref 0.0–1.0)

## 2017-04-05 MED ORDER — GLYBURIDE 2.5 MG PO TABS: 2.5000 mg | ORAL_TABLET | Freq: Every day | ORAL | 3 refills | Status: DC

## 2017-04-05 MED ORDER — FLUTICASONE PROPIONATE HFA 110 MCG/ACT IN AERO: 1.0000 | INHALATION_SPRAY | Freq: Two times a day (BID) | RESPIRATORY_TRACT | 12 refills | Status: DC

## 2017-04-05 MED ORDER — ALBUTEROL SULFATE HFA 108 (90 BASE) MCG/ACT IN AERS: 2.0000 | INHALATION_SPRAY | Freq: Four times a day (QID) | RESPIRATORY_TRACT | 11 refills | Status: DC | PRN

## 2017-04-05 NOTE — Progress Notes (Signed)
  Subjective:  Alexandria Beltran is a G2P1001 483w3d being seen today for her first obstetrical visit. [facility].  Her obstetrical history is significant for asthma and GDM. Has been checking blood sugars. Using SABA up to 4 times daily. SOB with ambulation. Patient does intend to breast feed. Pregnancy history fully reviewed.  GDM: Patient diet controlled. Did not bring log book - reports CBGS as Fasting: around 100 2hr PP: <120   Patient reports no complaints.  BP 110/64   Pulse 86   Wt (!) 309 lb 6.4 oz (140.3 kg)   LMP 09/25/2016   BMI 41.96 kg/m   HISTORY: OB History  Gravida Para Term Preterm AB Living  2 1 1     1   SAB TAB Ectopic Multiple Live Births        0 1    # Outcome Date GA Lbr Len/2nd Weight Sex Delivery Anes PTL Lv  2 Current           1 Term 07/24/16 5718w3d 02:24 / 00:19 7 lb 3.3 oz (3.27 kg) F Vag-Spont EPI  LIV      Past Medical History:  Diagnosis Date  . Anemia   . Asthma    Inhaler used 07/22/16  . Scoliosis     Past Surgical History:  Procedure Laterality Date  . NO PAST SURGERIES      Family History  Problem Relation Age of Onset  . Cancer Mother   . Diabetes Mother   . Alcohol abuse Mother   . Alcohol abuse Maternal Aunt   . Cancer Maternal Uncle   . Alcohol abuse Maternal Uncle      Exam    Uterus:     [facility]el[facility]: [facility]  System:     Skin: normal coloration and turgor, no rashes    Neurologic: gait normal; reflexes normal and symmetric   Extremities: normal strength, tone, and muscle mass   HEENT PERRLA and extra ocular movement intact   Mouth/Teeth mucous membranes moist, pharynx normal without lesions   Neck supple and no masses   Cardiovascular: regular rate and rhythm, no murmurs or gallops   Respiratory:  appears well, vitals normal, no respiratory distress, acyanotic, normal RR, ear and throat exam is normal, neck free of mass or lymphadenopathy, chest clear, no  wheezing, crepitations, rhonchi, normal symmetric air entry   Abdomen: soft, non-tender; bowel sounds normal; no masses,  no organomegaly      Assessment:    Pregnancy: G2P1001 Patient Active Problem List   Diagnosis Date Noted  . Supervision of high risk pregnancy, antepartum 04/05/2017  . Asthma affecting pregnancy in third trimester 04/05/2017  . Second trimester pregnancy 03/31/2017  . Post term pregnancy at [redacted] weeks gestation 07/23/2016      Plan:   1. Supervision of high risk pregnancy, antepartum FHT and FH normal  2. Asthma affecting pregnancy in third trimester Will prescribe flovent  3. GDM, class A2 Start glyburide 2.5mg  at bedtime for AM blood sugars     Problem list reviewed and updated. 75% of 30 min visit spent on counseling and coordination of care.     Levie HeritageJacob J Stinson 04/05/2017

## 2017-04-06 ENCOUNTER — Encounter: Payer: Self-pay | Admitting: *Deleted

## 2017-04-16 ENCOUNTER — Encounter: Payer: Medicaid Other | Admitting: Obstetrics & Gynecology

## 2017-04-20 ENCOUNTER — Institutional Professional Consult (permissible substitution): Payer: Medicaid Other | Admitting: Pulmonary Disease

## 2017-05-07 ENCOUNTER — Ambulatory Visit (INDEPENDENT_AMBULATORY_CARE_PROVIDER_SITE_OTHER): Payer: Medicaid Other | Admitting: Family Medicine

## 2017-05-07 VITALS — BP 125/61 | HR 99 | Wt 297.7 lb

## 2017-05-07 DIAGNOSIS — L309 Dermatitis, unspecified: Secondary | ICD-10-CM

## 2017-05-07 DIAGNOSIS — O0993 Supervision of high risk pregnancy, unspecified, third trimester: Secondary | ICD-10-CM

## 2017-05-07 DIAGNOSIS — J45909 Unspecified asthma, uncomplicated: Secondary | ICD-10-CM | POA: Diagnosis not present

## 2017-05-07 DIAGNOSIS — O99513 Diseases of the respiratory system complicating pregnancy, third trimester: Secondary | ICD-10-CM | POA: Diagnosis not present

## 2017-05-07 DIAGNOSIS — O0990 Supervision of high risk pregnancy, unspecified, unspecified trimester: Secondary | ICD-10-CM

## 2017-05-07 DIAGNOSIS — Z23 Encounter for immunization: Secondary | ICD-10-CM | POA: Diagnosis not present

## 2017-05-07 DIAGNOSIS — O24419 Gestational diabetes mellitus in pregnancy, unspecified control: Secondary | ICD-10-CM | POA: Diagnosis not present

## 2017-05-07 LAB — POCT URINALYSIS DIP (DEVICE)
Bilirubin Urine: NEGATIVE
Glucose, UA: NEGATIVE mg/dL
HGB URINE DIPSTICK: NEGATIVE
Ketones, ur: NEGATIVE mg/dL
Nitrite: NEGATIVE
PH: 6 (ref 5.0–8.0)
PROTEIN: 30 mg/dL — AB
SPECIFIC GRAVITY, URINE: 1.025 (ref 1.005–1.030)
UROBILINOGEN UA: 0.2 mg/dL (ref 0.0–1.0)

## 2017-05-07 MED ORDER — TRIAMCINOLONE ACETONIDE 0.5 % EX OINT: 1.0000 "application " | TOPICAL_OINTMENT | Freq: Two times a day (BID) | CUTANEOUS | 2 refills | Status: DC

## 2017-05-07 NOTE — Progress Notes (Signed)
Pt has had 12 lbs weight loss since last visit

## 2017-05-07 NOTE — Progress Notes (Signed)
Subjective:  Alexandria Beltran is a 27 y.o. G2P1001 at 6616w0d being seen today for ongoing prenatal care.  She is currently monitored for the following issues for this high-risk pregnancy and has Supervision of high risk pregnancy, antepartum; Asthma affecting pregnancy in third trimester; and GDM, class A2 on their problem list.  GDM: Patient taking glyburide 2.5mg  Qhs.  Reports no hypoglycemic episodes.  Tolerating medication well. Did not bring log book. Reports CBGs as: Fasting: 80s 2hr PP: 110-115  Patient reports no complaints.  Contractions: Not present. Vag. Bleeding: None.  Movement: Present. Denies leaking of fluid.   The following portions of the patient's history were reviewed and updated as appropriate: allergies, current medications, past family history, past medical history, past social history, past surgical history and problem list. Problem list updated.  Objective:   Vitals:   05/07/17 0902  BP: 125/61  Pulse: 99  Weight: 297 lb 11.2 oz (135 kg)    Fetal Status: Fetal Heart Rate (bpm): 142   Movement: Present     General:  Alert, oriented and cooperative. Patient is in no acute distress.  Skin: Skin is warm and dry. No rash noted.   Cardiovascular: Normal heart rate noted  Respiratory: Normal respiratory effort, no problems with respiration noted  Abdomen: Soft, gravid, appropriate for gestational age. Pain/Pressure: Present     Pelvic: Vag. Bleeding: None     Cervical exam deferred        Extremities: Normal range of motion.  Edema: None  Mental Status: Normal mood and affect. Normal behavior. Normal judgment and thought content.   Urinalysis: Urine Protein: 1+ Urine Glucose: Negative  Assessment and Plan:  Pregnancy: G2P1001 at 4716w0d  1. Supervision of high risk pregnancy, antepartum FHT normal - Tdap vaccine greater than or equal to 7yo IM  2. Asthma affecting pregnancy in third trimester Controlled  3. GDM, class A2 Continue glyburide Discussed bringing  logbook NST today - reactive  4. Eczema Triamcinolone  Preterm labor symptoms and general obstetric precautions including but not limited to vaginal bleeding, contractions, leaking of fluid and fetal movement were reviewed in detail with the patient. Please refer to After Visit Summary for other counseling recommendations.  No Follow-up on file.   Levie HeritageStinson, Jacob J, DO

## 2017-05-11 ENCOUNTER — Ambulatory Visit (INDEPENDENT_AMBULATORY_CARE_PROVIDER_SITE_OTHER): Payer: Medicaid Other | Admitting: *Deleted

## 2017-05-11 ENCOUNTER — Ambulatory Visit: Payer: Self-pay

## 2017-05-11 VITALS — BP 117/62 | HR 65

## 2017-05-11 DIAGNOSIS — O24419 Gestational diabetes mellitus in pregnancy, unspecified control: Secondary | ICD-10-CM

## 2017-05-11 NOTE — Progress Notes (Signed)
Pt informed that the ultrasound is considered a limited OB ultrasound and is not intended to be a complete ultrasound exam.  Patient also informed that the ultrasound is not being completed with the intent of assessing for fetal or placental anomalies or any pelvic abnormalities.  Explained that the purpose of today's ultrasound is to assess for presentation, BPP and amniotic fluid volume.  Patient acknowledges the purpose of the exam and the limitations of the study.    Per chart review, US done @ [facility] on 02/11/17 for anatomy - none since. US for growth and BPP scheduled 3/25 @ 1515.

## 2017-05-12 NOTE — Progress Notes (Signed)
NST performed today was reviewed and was found to be reactive. Subsequent BPP performed today was also reviewed and was found to be 10/10. AFI was also normal. Continue recommended antenatal testing and prenatal care.  Duke Weisensel, MD, FACOG Obstetrician & Gynecologist, Faculty Practice [facility], [facility]   

## 2017-05-17 ENCOUNTER — Other Ambulatory Visit: Payer: Self-pay | Admitting: Obstetrics & Gynecology

## 2017-05-17 ENCOUNTER — Ambulatory Visit (INDEPENDENT_AMBULATORY_CARE_PROVIDER_SITE_OTHER): Payer: Medicaid Other | Admitting: General Practice

## 2017-05-17 ENCOUNTER — Ambulatory Visit (INDEPENDENT_AMBULATORY_CARE_PROVIDER_SITE_OTHER): Payer: Medicaid Other | Admitting: Family Medicine

## 2017-05-17 ENCOUNTER — Ambulatory Visit (INDEPENDENT_AMBULATORY_CARE_PROVIDER_SITE_OTHER): Payer: Medicaid Other | Admitting: Clinical

## 2017-05-17 ENCOUNTER — Ambulatory Visit (HOSPITAL_COMMUNITY)
Admission: RE | Admit: 2017-05-17 | Discharge: 2017-05-17 | Disposition: A | Discharge status: Home / Self Care | Payer: Medicaid Other | Source: Ambulatory Visit | Attending: Obstetrics & Gynecology | Admitting: Obstetrics & Gynecology

## 2017-05-17 VITALS — BP 124/66 | HR 100 | Wt 292.0 lb

## 2017-05-17 DIAGNOSIS — O99343 Other mental disorders complicating pregnancy, third trimester: Secondary | ICD-10-CM

## 2017-05-17 DIAGNOSIS — Z3A33 33 weeks gestation of pregnancy: Secondary | ICD-10-CM | POA: Insufficient documentation

## 2017-05-17 DIAGNOSIS — Z3689 Encounter for other specified antenatal screening: Secondary | ICD-10-CM | POA: Diagnosis present

## 2017-05-17 DIAGNOSIS — F331 Major depressive disorder, recurrent, moderate: Secondary | ICD-10-CM | POA: Diagnosis not present

## 2017-05-17 DIAGNOSIS — F329 Major depressive disorder, single episode, unspecified: Secondary | ICD-10-CM

## 2017-05-17 DIAGNOSIS — O24419 Gestational diabetes mellitus in pregnancy, unspecified control: Secondary | ICD-10-CM | POA: Diagnosis not present

## 2017-05-17 DIAGNOSIS — O99213 Obesity complicating pregnancy, third trimester: Secondary | ICD-10-CM | POA: Diagnosis not present

## 2017-05-17 DIAGNOSIS — O0993 Supervision of high risk pregnancy, unspecified, third trimester: Secondary | ICD-10-CM

## 2017-05-17 DIAGNOSIS — O0990 Supervision of high risk pregnancy, unspecified, unspecified trimester: Secondary | ICD-10-CM

## 2017-05-17 DIAGNOSIS — J4520 Mild intermittent asthma, uncomplicated: Secondary | ICD-10-CM

## 2017-05-17 DIAGNOSIS — F32A Depression, unspecified: Secondary | ICD-10-CM

## 2017-05-17 DIAGNOSIS — J45909 Unspecified asthma, uncomplicated: Secondary | ICD-10-CM

## 2017-05-17 DIAGNOSIS — O99513 Diseases of the respiratory system complicating pregnancy, third trimester: Secondary | ICD-10-CM

## 2017-05-17 DIAGNOSIS — O99340 Other mental disorders complicating pregnancy, unspecified trimester: Principal | ICD-10-CM

## 2017-05-17 LAB — POCT URINALYSIS DIP (DEVICE)
Glucose, UA: NEGATIVE mg/dL
KETONES UR: 40 mg/dL — AB
Leukocytes, UA: NEGATIVE
Nitrite: NEGATIVE
PH: 6 (ref 5.0–8.0)
PROTEIN: 100 mg/dL — AB
SPECIFIC GRAVITY, URINE: 1.025 (ref 1.005–1.030)
Urobilinogen, UA: 1 mg/dL (ref 0.0–1.0)

## 2017-05-17 MED ORDER — FLUOXETINE HCL 20 MG PO CAPS: 20.0000 mg | ORAL_CAPSULE | Freq: Every day | ORAL | 3 refills | Status: DC

## 2017-05-17 MED ORDER — MONTELUKAST SODIUM 10 MG PO TABS: 10.0000 mg | ORAL_TABLET | Freq: Every day | ORAL | 3 refills | Status: DC

## 2017-05-17 MED ORDER — ALBUTEROL SULFATE HFA 108 (90 BASE) MCG/ACT IN AERS: 2.0000 | INHALATION_SPRAY | Freq: Four times a day (QID) | RESPIRATORY_TRACT | 11 refills | Status: DC | PRN

## 2017-05-17 MED ORDER — GLUCOSE BLOOD VI STRP: 1.0000 | ORAL_STRIP | Freq: Four times a day (QID) | 12 refills | Status: DC

## 2017-05-17 MED ORDER — ACCU-CHEK FASTCLIX LANCETS MISC: 1.0000 | Freq: Four times a day (QID) | 10 refills | Status: DC

## 2017-05-17 MED ORDER — GLYBURIDE 2.5 MG PO TABS: 2.5000 mg | ORAL_TABLET | Freq: Every day | ORAL | 3 refills | Status: DC
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
## 2017-05-19 ENCOUNTER — Institutional Professional Consult (permissible substitution): Payer: Medicaid Other | Admitting: Pulmonary Disease

## 2017-05-20 ENCOUNTER — Encounter: Payer: Medicaid Other | Admitting: Family Medicine

## 2017-05-21 ENCOUNTER — Encounter: Payer: Medicaid Other | Admitting: Obstetrics and Gynecology

## 2017-05-21 ENCOUNTER — Other Ambulatory Visit: Payer: Medicaid Other

## 2017-05-21 NOTE — BH Specialist Note (Deleted)
Integrated Behavioral Health Follow Up Visit  MRN: [id number] Name: Alexandria Beltran  Number of Integrated Behavioral Health Clinician visits: {IBH Number of Visits:21014052} Session Start time: ***  Session End time: *** Total time: {IBH Total Time:21014050}  Type of Service: Integrated Behavioral Health- Individual/Family Interpretor:{yes WU:[id number]}no:[id number]} Interpretor Name and Language: ***  SUBJECTIVE: Alexandria Beltran is a 27 y.o. female accompanied by {Patient accompanied [phone]} Patient was referred by *** for ***. Patient reports the following symptoms/concerns: *** Duration of problem: ***; Severity of problem: {Mild/Moderate/Severe:20260}  OBJECTIVE: Mood: {BHH MOOD:22306} and Affect: {BHH AFFECT:22307} Risk of harm to self or others: {CHL AMB BH Suicide Current Mental Status:21022748}  LIFE CONTEXT: Family and Social: *** School/Work: *** Self-Care: *** Life Changes: ***  GOALS ADDRESSED: Patient will: 1.  Reduce symptoms of: {IBH Symptoms:21014056}  2.  Increase knowledge and/or ability of: {IBH Patient Tools:21014057}  3.  Demonstrate ability to: {IBH Goals:21014053}  INTERVENTIONS: Interventions utilized:  {IBH Interventions:21014054} Standardized Assessments completed: {IBH Screening Tools:21014051}  ASSESSMENT: Patient currently experiencing ***.   Patient may benefit from ***.  PLAN: 1. Follow up with behavioral health clinician on : *** 2. Behavioral recommendations: *** 3. Referral(s): {IBH Referrals:21014055} 4. "From scale of 1-10, how likely are you to follow plan?": ***  Alexandria CloseJamie C Evangelynn Lochridge, LCSW

## 2017-05-24 ENCOUNTER — Encounter: Payer: Medicaid Other | Admitting: Family Medicine

## 2017-05-24 ENCOUNTER — Ambulatory Visit: Payer: Self-pay

## 2017-05-24 ENCOUNTER — Other Ambulatory Visit: Payer: Medicaid Other

## 2017-05-31 ENCOUNTER — Encounter: Payer: Self-pay | Admitting: Family Medicine

## 2017-05-31 ENCOUNTER — Encounter: Payer: Medicaid Other | Admitting: Family Medicine

## 2017-05-31 ENCOUNTER — Other Ambulatory Visit: Payer: Medicaid Other

## 2017-05-31 ENCOUNTER — Telehealth: Payer: Self-pay | Admitting: *Deleted

## 2017-05-31 NOTE — Progress Notes (Signed)
Patient did not keep appointment today. She will be called to reschedule.  

## 2017-05-31 NOTE — Telephone Encounter (Signed)
Called pt regarding missed appt which was scheduled today @ 0815. Message left for pt to call office to reschedule for this week.

## 2017-06-04 ENCOUNTER — Encounter: Payer: Medicaid Other | Admitting: Obstetrics & Gynecology

## 2017-06-07 ENCOUNTER — Ambulatory Visit (INDEPENDENT_AMBULATORY_CARE_PROVIDER_SITE_OTHER): Payer: Medicaid Other | Admitting: Obstetrics & Gynecology

## 2017-06-07 ENCOUNTER — Ambulatory Visit (INDEPENDENT_AMBULATORY_CARE_PROVIDER_SITE_OTHER): Payer: Medicaid Other | Admitting: *Deleted

## 2017-06-07 ENCOUNTER — Other Ambulatory Visit (HOSPITAL_COMMUNITY)
Admission: RE | Admit: 2017-06-07 | Discharge: 2017-06-07 | Disposition: A | Discharge status: Home / Self Care | Payer: Medicaid Other | Source: Ambulatory Visit | Attending: Obstetrics & Gynecology | Admitting: Obstetrics & Gynecology

## 2017-06-07 ENCOUNTER — Ambulatory Visit: Payer: Self-pay

## 2017-06-07 ENCOUNTER — Encounter: Payer: Self-pay | Admitting: Obstetrics & Gynecology

## 2017-06-07 VITALS — BP 118/65 | HR 95 | Wt 287.6 lb

## 2017-06-07 DIAGNOSIS — O0990 Supervision of high risk pregnancy, unspecified, unspecified trimester: Secondary | ICD-10-CM | POA: Diagnosis not present

## 2017-06-07 DIAGNOSIS — O24419 Gestational diabetes mellitus in pregnancy, unspecified control: Secondary | ICD-10-CM

## 2017-06-07 DIAGNOSIS — F329 Major depressive disorder, single episode, unspecified: Secondary | ICD-10-CM

## 2017-06-07 DIAGNOSIS — O99340 Other mental disorders complicating pregnancy, unspecified trimester: Secondary | ICD-10-CM | POA: Diagnosis not present

## 2017-06-07 DIAGNOSIS — F32A Depression, unspecified: Secondary | ICD-10-CM

## 2017-06-07 LAB — POCT URINALYSIS DIP (DEVICE)
GLUCOSE, UA: NEGATIVE mg/dL
Hgb urine dipstick: NEGATIVE
Ketones, ur: 15 mg/dL — AB
NITRITE: NEGATIVE
Protein, ur: 100 mg/dL — AB
Specific Gravity, Urine: 1.025 (ref 1.005–1.030)
UROBILINOGEN UA: 2 mg/dL — AB (ref 0.0–1.0)
pH: 6.5 (ref 5.0–8.0)

## 2017-06-07 NOTE — Progress Notes (Signed)
   PRENATAL VISIT NOTE  Subjective:  Alexandria Beltran is a 27 y.o. G2P1001 at 4812w3d being seen today for ongoing prenatal care.  She is currently monitored for the following issues for this high-risk pregnancy and has Supervision of high risk pregnancy, antepartum; Asthma affecting pregnancy in third trimester; and GDM, class A2 on their problem list.  Patient reports no complaints.  Contractions: Irregular. Vag. Bleeding: None.  Movement: Present. Denies leaking of fluid.   The following portions of the patient's history were reviewed and updated as appropriate: allergies, current medications, past family history, past medical history, past social history, past surgical history and problem list. Problem list updated.  Objective:   Vitals:   06/07/17 0904  BP: 118/65  Pulse: 95  Weight: 287 lb 9.6 oz (130.5 kg)    Fetal Status: Fetal Heart Rate (bpm): NST   Movement: Present     General:  Alert, oriented and cooperative. Patient is in no acute distress.  Skin: Skin is warm and dry. No rash noted.   Cardiovascular: Normal heart rate noted  Respiratory: Normal respiratory effort, no problems with respiration noted  Abdomen: Soft, gravid, appropriate for gestational age.  Pain/Pressure: Present     Pelvic: Cervical exam deferred        Extremities: Normal range of motion.  Edema: None  Mental Status: Normal mood and affect. Normal behavior. Normal judgment and thought content.   Assessment and Plan:  Pregnancy: G2P1001 at 5012w3d  1. Supervision of high risk pregnancy, antepartum Routine testing - GC/Chlamydia probe amp ([place] - Strep Gp B NAA - US MFM OB FOLLOW UP; Future  2. GDM, class A2 States BG is stable - US MFM OB FOLLOW UP; Future  3. Depression affecting pregnancy stable  Preterm labor symptoms and general obstetric precautions including but not limited to vaginal bleeding, contractions, leaking of fluid and fetal movement were reviewed in detail with  the patient. Please refer to After Visit Summary for other counseling recommendations.  Return in about 1 week (around 06/14/2017) for as scheduled.  Future Appointments  Date Time Provider Department Center  06/14/2017  8:15 AM WOC-WOCA NST WOC-WOCA WOC  06/14/2017  9:15 AM Meigs BingPickens, Charlie, MD WOC-WOCA WOC  06/17/2017  8:30 AM WH-MFC US 1 WH-MFCUS MFC-US  06/21/2017  8:15 AM WOC-WOCA NST WOC-WOCA WOC  06/21/2017  9:15 AM Adam PhenixArnold, James G, MD WOC-WOCA WOC  07/29/2017  9:20 AM Massie MaroonHollis, Lachina M, FNP SCC-SCC None    Scheryl DarterJames Arnold, MD

## 2017-06-07 NOTE — Progress Notes (Signed)

## 2017-06-07 NOTE — Progress Notes (Signed)
Pt states she is checking CBG 3 times daily. She reports episodes of nausea

## 2017-06-07 NOTE — Patient Instructions (Signed)

## 2017-06-08 LAB — GC/CHLAMYDIA PROBE AMP (~~LOC~~) NOT AT ARMC
CHLAMYDIA, DNA PROBE: NEGATIVE
NEISSERIA GONORRHEA: NEGATIVE

## 2017-06-09 LAB — STREP GP B NAA: Strep Gp B NAA: NEGATIVE

## 2017-06-10 ENCOUNTER — Encounter: Payer: Medicaid Other | Admitting: Obstetrics & Gynecology

## 2017-06-14 ENCOUNTER — Other Ambulatory Visit: Payer: Medicaid Other

## 2017-06-14 ENCOUNTER — Encounter: Payer: Self-pay | Admitting: Obstetrics and Gynecology

## 2017-06-14 ENCOUNTER — Encounter: Payer: Medicaid Other | Admitting: Obstetrics and Gynecology

## 2017-06-14 ENCOUNTER — Telehealth: Payer: Self-pay | Admitting: Obstetrics and Gynecology

## 2017-06-14 NOTE — Telephone Encounter (Signed)
Patient was not here at 8:15 am. Called patient to see if she was coming to her appointment.

## 2017-06-15 ENCOUNTER — Other Ambulatory Visit: Payer: Self-pay | Admitting: *Deleted

## 2017-06-15 DIAGNOSIS — O24419 Gestational diabetes mellitus in pregnancy, unspecified control: Secondary | ICD-10-CM

## 2017-06-15 NOTE — Progress Notes (Unsigned)
BPP added to MFM US on 4/25 [facility] fetal testing and provider visit.

## 2017-06-15 NOTE — Progress Notes (Signed)
Patient did not keep OB appointment for 06/14/2017.  Odie Rauen, Jr MD Attending [facility] (Faculty Practice)   

## 2017-06-17 ENCOUNTER — Other Ambulatory Visit: Payer: Self-pay | Admitting: Obstetrics and Gynecology

## 2017-06-17 ENCOUNTER — Encounter (HOSPITAL_COMMUNITY): Payer: Self-pay

## 2017-06-17 ENCOUNTER — Encounter: Payer: Medicaid Other | Admitting: Family Medicine

## 2017-06-17 ENCOUNTER — Other Ambulatory Visit: Payer: Self-pay | Admitting: Obstetrics & Gynecology

## 2017-06-17 ENCOUNTER — Ambulatory Visit (HOSPITAL_COMMUNITY)
Admission: RE | Admit: 2017-06-17 | Discharge: 2017-06-17 | Disposition: A | Discharge status: Home / Self Care | Payer: Medicaid Other | Source: Ambulatory Visit | Attending: Obstetrics & Gynecology | Admitting: Obstetrics & Gynecology

## 2017-06-17 DIAGNOSIS — O24419 Gestational diabetes mellitus in pregnancy, unspecified control: Secondary | ICD-10-CM

## 2017-06-17 DIAGNOSIS — O0993 Supervision of high risk pregnancy, unspecified, third trimester: Secondary | ICD-10-CM | POA: Diagnosis not present

## 2017-06-17 DIAGNOSIS — Z3A37 37 weeks gestation of pregnancy: Secondary | ICD-10-CM | POA: Diagnosis not present

## 2017-06-17 DIAGNOSIS — O0990 Supervision of high risk pregnancy, unspecified, unspecified trimester: Secondary | ICD-10-CM

## 2017-06-17 HISTORY — DX: Gestational diabetes mellitus in pregnancy, unspecified control: O24.419

## 2017-06-21 ENCOUNTER — Ambulatory Visit (INDEPENDENT_AMBULATORY_CARE_PROVIDER_SITE_OTHER): Payer: Medicaid Other | Admitting: Obstetrics & Gynecology

## 2017-06-21 ENCOUNTER — Ambulatory Visit: Payer: Self-pay

## 2017-06-21 ENCOUNTER — Ambulatory Visit: Payer: Self-pay | Admitting: *Deleted

## 2017-06-21 ENCOUNTER — Telehealth (HOSPITAL_COMMUNITY): Payer: Self-pay | Admitting: *Deleted

## 2017-06-21 DIAGNOSIS — O24419 Gestational diabetes mellitus in pregnancy, unspecified control: Secondary | ICD-10-CM

## 2017-06-21 DIAGNOSIS — O0990 Supervision of high risk pregnancy, unspecified, unspecified trimester: Secondary | ICD-10-CM

## 2017-06-21 DIAGNOSIS — O0993 Supervision of high risk pregnancy, unspecified, third trimester: Secondary | ICD-10-CM

## 2017-06-21 MED ORDER — DOCUSATE SODIUM 100 MG PO CAPS: 100.0000 mg | ORAL_CAPSULE | Freq: Two times a day (BID) | ORAL | 2 refills | Status: DC | PRN

## 2017-06-21 MED ORDER — POLYETHYLENE GLYCOL 3350 17 G PO PACK: 17.0000 g | PACK | Freq: Every day | ORAL | Status: DC

## 2017-06-21 MED ORDER — CETIRIZINE HCL 10 MG PO TABS: 10.0000 mg | ORAL_TABLET | Freq: Every day | ORAL | 1 refills | Status: DC

## 2017-06-21 NOTE — Patient Instructions (Signed)
Labor Induction Labor induction is when steps are taken to cause a pregnant woman to begin the labor process. Most women go into labor on their own between 37 weeks and 42 weeks of the pregnancy. When this does not happen or when there is a medical need, methods may be used to induce labor. Labor induction causes a pregnant woman's uterus to contract. It also causes the cervix to soften (ripen), open (dilate), and thin out (efface). Usually, labor is not induced before 39 weeks of the pregnancy unless there is a problem with the baby or mother. Before inducing labor, your health care provider will consider a number of factors, including the following:  The medical condition of you and the baby.  How many weeks along you are.  The status of the baby's lung maturity.  The condition of the cervix.  The position of the baby. What are the reasons for labor induction? Labor may be induced for the following reasons:  The health of the baby or mother is at risk.  The pregnancy is overdue by 1 week or more.  The water breaks but labor does not start on its own.  The mother has a health condition or serious illness, such as high blood pressure, infection, placental abruption, or diabetes.  The amniotic fluid amounts are low around the baby.  The baby is distressed. Convenience or wanting the baby to be born on a certain date is not a reason for inducing labor. What methods are used for labor induction? Several methods of labor induction may be used, such as:  Prostaglandin medicine. This medicine causes the cervix to dilate and ripen. The medicine will also start contractions. It can be taken by mouth or by inserting a suppository into the vagina.  Inserting a thin tube (catheter) with a balloon on the end into the vagina to dilate the cervix. Once inserted, the balloon is expanded with water, which causes the cervix to open.  Stripping the membranes. Your health care provider separates  amniotic sac tissue from the cervix, causing the cervix to be stretched and causing the release of a hormone called progesterone. This may cause the uterus to contract. It is often done during an office visit. You will be sent home to wait for the contractions to begin. You will then come in for an induction.  Breaking the water. Your health care provider makes a hole in the amniotic sac using a small instrument. Once the amniotic sac breaks, contractions should begin. This may still take hours to see an effect.  Medicine to trigger or strengthen contractions. This medicine is given through an IV access tube inserted into a vein in your arm. All of the methods of induction, besides stripping the membranes, will be done in the hospital. Induction is done in the hospital so that you and the baby can be carefully monitored. How long does it take for labor to be induced? Some inductions can take up to 2-3 days. Depending on the cervix, it usually takes less time. It takes longer when you are induced early in the pregnancy or if this is your first pregnancy. If a mother is still pregnant and the induction has been going on for 2-3 days, either the mother will be sent home or a cesarean delivery will be needed. What are the risks associated with labor induction? Some of the risks of induction include:  Changes in fetal heart rate, such as too high, too low, or erratic.  Fetal distress.    Chance of infection for the mother and baby.  Increased chance of having a cesarean delivery.  Breaking off (abruption) of the placenta from the uterus (rare).  Uterine rupture (very rare). When induction is needed for medical reasons, the benefits of induction may outweigh the risks. What are some reasons for not inducing labor? Labor induction should not be done if:  It is shown that your baby does not tolerate labor.  You have had previous surgeries on your uterus, such as a myomectomy or the removal of  fibroids.  Your placenta lies very low in the uterus and blocks the opening of the cervix (placenta previa).  Your baby is not in a head-down position.  The umbilical cord drops down into the birth canal in front of the baby. This could cut off the baby's blood and oxygen supply.  You have had a previous cesarean delivery.  There are unusual circumstances, such as the baby being extremely premature. This information is not intended to replace advice given to you by your health care provider. Make sure you discuss any questions you have with your health care provider. Document Released: 07/01/2006 Document Revised: 07/18/2015 Document Reviewed: 09/08/2012 Elsevier Interactive Patient Education  2017 Elsevier Inc.  

## 2017-06-21 NOTE — Progress Notes (Signed)

## 2017-06-21 NOTE — Progress Notes (Signed)
   PRENATAL VISIT NOTE  Subjective:  Alexandria Beltran is a 27 y.o. G2P1001 at [redacted]w[redacted]d being seen today for ongoing prenatal care.  She is currently monitored for the following issues for this high-risk pregnancy and has Supervision of high risk pregnancy, antepartum; Asthma affecting pregnancy in third trimester; and GDM, class A2 on their problem list.  Patient reports no complaints.   .  .   . Denies leaking of fluid.   The following portions of the patient's history were reviewed and updated as appropriate: allergies, current medications, past family history, past medical history, past social history, past surgical history and problem list. Problem list updated.  Objective:  There were no vitals filed for this visit.  Fetal Status:           General:  Alert, oriented and cooperative. Patient is in no acute distress.  Skin: Skin is warm and dry. No rash noted.   Cardiovascular: Normal heart rate noted  Respiratory: Normal respiratory effort, no problems with respiration noted  Abdomen: Soft, gravid, appropriate for gestational age.        Pelvic: Cervical exam deferred        Extremities: Normal range of motion.     Mental Status: Normal mood and affect. Normal behavior. Normal judgment and thought content.   Assessment and Plan:  Pregnancy: G2P1001 at [redacted]w[redacted]d  1. GDM, class A2 States control is stable  2. Supervision of high risk pregnancy, antepartum IOL 39 weeks  Term labor symptoms and general obstetric precautions including but not limited to vaginal bleeding, contractions, leaking of fluid and fetal movement were reviewed in detail with the patient. Please refer to After Visit Summary for other counseling recommendations.  Return in about 5 weeks (around 07/26/2017) for postpartum w/IUD insertion and [facility] appt same day.  Future Appointments  Date Time Provider Department Center  06/25/2017 12:00 AM WH-BSSCHED ROOM WH-BSSCHED None  07/26/2017  9:15 AM Armando Reichert, CNM WOC-WOCA  WOC  07/26/2017 10:30 AM WOC-BEHAVIORAL HEALTH CLINICIAN WOC-WOCA WOC  07/29/2017  9:20 AM Massie Maroon, FNP SCC-SCC None    Scheryl Darter, MD

## 2017-06-21 NOTE — Progress Notes (Signed)
Pt reports constipation and sx of seasonal allergies. IOL scheduled 5/3 @ midnight

## 2017-06-21 NOTE — Telephone Encounter (Signed)
Preadmission screen  

## 2017-06-24 ENCOUNTER — Inpatient Hospital Stay (HOSPITAL_COMMUNITY)
Admission: AD | Admit: 2017-06-24 | Discharge: 2017-06-24 | Discharge status: Absent without leave | Payer: Medicaid Other | Source: Home / Self Care | Attending: Obstetrics and Gynecology | Admitting: Obstetrics and Gynecology

## 2017-06-25 ENCOUNTER — Inpatient Hospital Stay (HOSPITAL_COMMUNITY): Payer: Medicaid Other | Admitting: Anesthesiology

## 2017-06-25 ENCOUNTER — Encounter (HOSPITAL_COMMUNITY): Payer: Self-pay

## 2017-06-25 ENCOUNTER — Inpatient Hospital Stay (HOSPITAL_COMMUNITY)
Admission: RE | Admit: 2017-06-25 | Discharge: 2017-06-27 | DRG: 807 | Disposition: A | Discharge status: Home / Self Care | Payer: Medicaid Other | Source: Ambulatory Visit | Attending: Obstetrics & Gynecology | Admitting: Obstetrics & Gynecology

## 2017-06-25 DIAGNOSIS — Z3A39 39 weeks gestation of pregnancy: Secondary | ICD-10-CM

## 2017-06-25 DIAGNOSIS — J45909 Unspecified asthma, uncomplicated: Secondary | ICD-10-CM | POA: Diagnosis present

## 2017-06-25 DIAGNOSIS — O2412 Pre-existing diabetes mellitus, type 2, in childbirth: Secondary | ICD-10-CM

## 2017-06-25 DIAGNOSIS — O26893 Other specified pregnancy related conditions, third trimester: Secondary | ICD-10-CM | POA: Diagnosis present

## 2017-06-25 DIAGNOSIS — O99214 Obesity complicating childbirth: Secondary | ICD-10-CM | POA: Diagnosis present

## 2017-06-25 DIAGNOSIS — O9952 Diseases of the respiratory system complicating childbirth: Secondary | ICD-10-CM | POA: Diagnosis present

## 2017-06-25 DIAGNOSIS — Z87891 Personal history of nicotine dependence: Secondary | ICD-10-CM

## 2017-06-25 DIAGNOSIS — O24419 Gestational diabetes mellitus in pregnancy, unspecified control: Secondary | ICD-10-CM | POA: Diagnosis present

## 2017-06-25 DIAGNOSIS — D649 Anemia, unspecified: Secondary | ICD-10-CM | POA: Diagnosis present

## 2017-06-25 DIAGNOSIS — O9902 Anemia complicating childbirth: Secondary | ICD-10-CM | POA: Diagnosis present

## 2017-06-25 DIAGNOSIS — O24425 Gestational diabetes mellitus in childbirth, controlled by oral hypoglycemic drugs: Secondary | ICD-10-CM | POA: Diagnosis present

## 2017-06-25 DIAGNOSIS — O99513 Diseases of the respiratory system complicating pregnancy, third trimester: Secondary | ICD-10-CM

## 2017-06-25 DIAGNOSIS — M419 Scoliosis, unspecified: Secondary | ICD-10-CM | POA: Diagnosis present

## 2017-06-25 DIAGNOSIS — O0990 Supervision of high risk pregnancy, unspecified, unspecified trimester: Secondary | ICD-10-CM

## 2017-06-25 LAB — GLUCOSE, CAPILLARY
GLUCOSE-CAPILLARY: 88 mg/dL (ref 65–99)
GLUCOSE-CAPILLARY: 88 mg/dL (ref 65–99)
Glucose-Capillary: 102 mg/dL — ABNORMAL HIGH (ref 65–99)
Glucose-Capillary: 122 mg/dL — ABNORMAL HIGH (ref 65–99)
Glucose-Capillary: 89 mg/dL (ref 65–99)

## 2017-06-25 LAB — CBC
HCT: 26.3 % — ABNORMAL LOW (ref 36.0–46.0)
Hemoglobin: 8.3 g/dL — ABNORMAL LOW (ref 12.0–15.0)
MCH: 25.6 pg — AB (ref 26.0–34.0)
MCHC: 31.6 g/dL (ref 30.0–36.0)
MCV: 81.2 fL (ref 78.0–100.0)
PLATELETS: 274 10*3/uL (ref 150–400)
RBC: 3.24 MIL/uL — ABNORMAL LOW (ref 3.87–5.11)
RDW: 15.5 % (ref 11.5–15.5)
WBC: 7.3 10*3/uL (ref 4.0–10.5)

## 2017-06-25 LAB — TYPE AND SCREEN
ABO/RH(D): O POS
Antibody Screen: NEGATIVE

## 2017-06-25 LAB — RPR: RPR Ser Ql: NONREACTIVE

## 2017-06-25 MED ORDER — OXYTOCIN 40 UNITS IN LACTATED RINGERS INFUSION - SIMPLE MED
1.0000 m[IU]/min | INTRAVENOUS | Status: DC
  Administered 2017-06-25: 2 m[IU]/min via INTRAVENOUS
  Filled 2017-06-25: qty 1000

## 2017-06-25 MED ORDER — SENNOSIDES-DOCUSATE SODIUM 8.6-50 MG PO TABS
2.0000 | ORAL_TABLET | ORAL | Status: DC
  Administered 2017-06-25 – 2017-06-26 (×2): 2 via ORAL
  Filled 2017-06-25 (×2): qty 2

## 2017-06-25 MED ORDER — TERBUTALINE SULFATE 1 MG/ML IJ SOLN: 0.2500 mg | Freq: Once | INTRAMUSCULAR | Status: DC | PRN

## 2017-06-25 MED ORDER — OXYTOCIN BOLUS FROM INFUSION
500.0000 mL | Freq: Once | INTRAVENOUS | Status: AC
  Administered 2017-06-25: 500 mL via INTRAVENOUS

## 2017-06-25 MED ORDER — OXYCODONE-ACETAMINOPHEN 5-325 MG PO TABS: 2.0000 | ORAL_TABLET | ORAL | Status: DC | PRN

## 2017-06-25 MED ORDER — SODIUM BICARBONATE 8.4 % IV SOLN
INTRAVENOUS | Status: DC | PRN
  Administered 2017-06-25 (×2): 5 mL via EPIDURAL

## 2017-06-25 MED ORDER — TERBUTALINE SULFATE 1 MG/ML IJ SOLN
0.2500 mg | Freq: Once | INTRAMUSCULAR | Status: DC | PRN
  Filled 2017-06-25: qty 1

## 2017-06-25 MED ORDER — OXYCODONE-ACETAMINOPHEN 5-325 MG PO TABS: 1.0000 | ORAL_TABLET | ORAL | Status: DC | PRN

## 2017-06-25 MED ORDER — MISOPROSTOL 25 MCG QUARTER TABLET
25.0000 ug | ORAL_TABLET | ORAL | Status: DC | PRN
  Administered 2017-06-25 (×2): 25 ug via VAGINAL
  Filled 2017-06-25 (×2): qty 1

## 2017-06-25 MED ORDER — LACTATED RINGERS IV SOLN
500.0000 mL | Freq: Once | INTRAVENOUS | Status: AC
  Administered 2017-06-25: 500 mL via INTRAVENOUS

## 2017-06-25 MED ORDER — ACETAMINOPHEN 325 MG PO TABS
650.0000 mg | ORAL_TABLET | ORAL | Status: DC | PRN
  Administered 2017-06-26: 650 mg via ORAL
  Filled 2017-06-25: qty 2

## 2017-06-25 MED ORDER — ONDANSETRON HCL 4 MG PO TABS: 4.0000 mg | ORAL_TABLET | ORAL | Status: DC | PRN

## 2017-06-25 MED ORDER — PHENYLEPHRINE 40 MCG/ML (10ML) SYRINGE FOR IV PUSH (FOR BLOOD PRESSURE SUPPORT): 80.0000 ug | PREFILLED_SYRINGE | INTRAVENOUS | Status: DC | PRN

## 2017-06-25 MED ORDER — ALBUTEROL SULFATE HFA 108 (90 BASE) MCG/ACT IN AERS: 2.0000 | INHALATION_SPRAY | Freq: Four times a day (QID) | RESPIRATORY_TRACT | Status: DC | PRN

## 2017-06-25 MED ORDER — ONDANSETRON HCL 4 MG/2ML IJ SOLN: 4.0000 mg | Freq: Four times a day (QID) | INTRAMUSCULAR | Status: DC | PRN

## 2017-06-25 MED ORDER — ALBUTEROL SULFATE (2.5 MG/3ML) 0.083% IN NEBU
2.5000 mg | INHALATION_SOLUTION | RESPIRATORY_TRACT | Status: DC | PRN
  Administered 2017-06-25: 2.5 mg via RESPIRATORY_TRACT
  Filled 2017-06-25: qty 3

## 2017-06-25 MED ORDER — LORATADINE 10 MG PO TABS
10.0000 mg | ORAL_TABLET | Freq: Every day | ORAL | Status: DC
  Administered 2017-06-25 – 2017-06-27 (×3): 10 mg via ORAL
  Filled 2017-06-25 (×4): qty 1

## 2017-06-25 MED ORDER — BENZOCAINE-MENTHOL 20-0.5 % EX AERO
1.0000 "application " | INHALATION_SPRAY | CUTANEOUS | Status: DC | PRN
  Filled 2017-06-25: qty 56

## 2017-06-25 MED ORDER — LIDOCAINE HCL (PF) 1 % IJ SOLN
INTRAMUSCULAR | Status: DC | PRN
  Administered 2017-06-25 (×2): 5 mL via EPIDURAL

## 2017-06-25 MED ORDER — OXYCODONE HCL 5 MG PO TABS: 5.0000 mg | ORAL_TABLET | ORAL | Status: DC | PRN

## 2017-06-25 MED ORDER — MONTELUKAST SODIUM 10 MG PO TABS
10.0000 mg | ORAL_TABLET | Freq: Every day | ORAL | Status: DC
  Administered 2017-06-26: 10 mg via ORAL
  Filled 2017-06-25 (×3): qty 1

## 2017-06-25 MED ORDER — WITCH HAZEL-GLYCERIN EX PADS: 1.0000 "application " | MEDICATED_PAD | CUTANEOUS | Status: DC | PRN

## 2017-06-25 MED ORDER — LACTATED RINGERS IV SOLN: 500.0000 mL | INTRAVENOUS | Status: DC | PRN

## 2017-06-25 MED ORDER — DIBUCAINE 1 % RE OINT
1.0000 "application " | TOPICAL_OINTMENT | RECTAL | Status: DC | PRN
  Filled 2017-06-25: qty 28

## 2017-06-25 MED ORDER — ACETAMINOPHEN 325 MG PO TABS
650.0000 mg | ORAL_TABLET | ORAL | Status: DC | PRN
  Administered 2017-06-25: 650 mg via ORAL
  Filled 2017-06-25: qty 2

## 2017-06-25 MED ORDER — IBUPROFEN 600 MG PO TABS
600.0000 mg | ORAL_TABLET | Freq: Four times a day (QID) | ORAL | Status: DC
  Administered 2017-06-26 – 2017-06-27 (×7): 600 mg via ORAL
  Filled 2017-06-25 (×7): qty 1

## 2017-06-25 MED ORDER — EPHEDRINE 5 MG/ML INJ: 10.0000 mg | INTRAVENOUS | Status: DC | PRN

## 2017-06-25 MED ORDER — COCONUT OIL OIL
1.0000 "application " | TOPICAL_OIL | Status: DC | PRN
  Administered 2017-06-27: 1 via TOPICAL
  Filled 2017-06-25 (×2): qty 120

## 2017-06-25 MED ORDER — LACTATED RINGERS IV SOLN
INTRAVENOUS | Status: DC
  Administered 2017-06-25 (×4): via INTRAVENOUS

## 2017-06-25 MED ORDER — FLEET ENEMA 7-19 GM/118ML RE ENEM: 1.0000 | ENEMA | RECTAL | Status: DC | PRN

## 2017-06-25 MED ORDER — OXYTOCIN 40 UNITS IN LACTATED RINGERS INFUSION - SIMPLE MED: 2.5000 [IU]/h | INTRAVENOUS | Status: DC

## 2017-06-25 MED ORDER — FENTANYL 2.5 MCG/ML BUPIVACAINE 1/10 % EPIDURAL INFUSION (WH - ANES)
14.0000 mL/h | INTRAMUSCULAR | Status: DC | PRN
  Administered 2017-06-25: 16 mL/h via EPIDURAL
  Administered 2017-06-25 (×2): 14 mL/h via EPIDURAL
  Filled 2017-06-25 (×2): qty 100

## 2017-06-25 MED ORDER — DIPHENHYDRAMINE HCL 50 MG/ML IJ SOLN
12.5000 mg | INTRAMUSCULAR | Status: DC | PRN
  Administered 2017-06-25 (×2): 12.5 mg via INTRAVENOUS
  Filled 2017-06-25: qty 1

## 2017-06-25 MED ORDER — ZOLPIDEM TARTRATE 5 MG PO TABS: 5.0000 mg | ORAL_TABLET | Freq: Every evening | ORAL | Status: DC | PRN

## 2017-06-25 MED ORDER — TETANUS-DIPHTH-ACELL PERTUSSIS 5-2.5-18.5 LF-MCG/0.5 IM SUSP
0.5000 mL | Freq: Once | INTRAMUSCULAR | Status: DC
  Filled 2017-06-25: qty 0.5

## 2017-06-25 MED ORDER — FENTANYL CITRATE (PF) 100 MCG/2ML IJ SOLN
100.0000 ug | INTRAMUSCULAR | Status: DC | PRN
  Administered 2017-06-25 (×2): 100 ug via INTRAVENOUS
  Filled 2017-06-25 (×2): qty 2

## 2017-06-25 MED ORDER — SOD CITRATE-CITRIC ACID 500-334 MG/5ML PO SOLN
30.0000 mL | ORAL | Status: DC | PRN
  Filled 2017-06-25: qty 15

## 2017-06-25 MED ORDER — DIPHENHYDRAMINE HCL 25 MG PO CAPS: 25.0000 mg | ORAL_CAPSULE | Freq: Four times a day (QID) | ORAL | Status: DC | PRN

## 2017-06-25 MED ORDER — PHENYLEPHRINE 40 MCG/ML (10ML) SYRINGE FOR IV PUSH (FOR BLOOD PRESSURE SUPPORT)
80.0000 ug | PREFILLED_SYRINGE | INTRAVENOUS | Status: DC | PRN
  Filled 2017-06-25: qty 10

## 2017-06-25 MED ORDER — FLUOXETINE HCL 20 MG PO CAPS
20.0000 mg | ORAL_CAPSULE | Freq: Every day | ORAL | Status: DC
  Administered 2017-06-26 (×2): 20 mg via ORAL
  Filled 2017-06-25 (×5): qty 1

## 2017-06-25 MED ORDER — SIMETHICONE 80 MG PO CHEW: 80.0000 mg | CHEWABLE_TABLET | ORAL | Status: DC | PRN

## 2017-06-25 MED ORDER — PRENATAL MULTIVITAMIN CH
1.0000 | ORAL_TABLET | Freq: Every day | ORAL | Status: DC
  Administered 2017-06-26 – 2017-06-27 (×2): 1 via ORAL
  Filled 2017-06-25 (×2): qty 1

## 2017-06-25 MED ORDER — ZOLPIDEM TARTRATE 5 MG PO TABS
5.0000 mg | ORAL_TABLET | Freq: Every evening | ORAL | Status: DC | PRN
  Administered 2017-06-25: 5 mg via ORAL
  Filled 2017-06-25: qty 1

## 2017-06-25 MED ORDER — ONDANSETRON HCL 4 MG/2ML IJ SOLN: 4.0000 mg | INTRAMUSCULAR | Status: DC | PRN

## 2017-06-25 MED ORDER — LIDOCAINE HCL (PF) 1 % IJ SOLN
30.0000 mL | INTRAMUSCULAR | Status: DC | PRN
  Administered 2017-06-25: 30 mL via SUBCUTANEOUS
  Filled 2017-06-25 (×2): qty 30

## 2017-06-25 NOTE — Anesthesia Preprocedure Evaluation (Signed)
Anesthesia Evaluation  Patient identified by MRN, date of birth, ID band Patient awake    Reviewed: Allergy & Precautions, Patient's Chart, lab work & pertinent test results  Airway Mallampati: II  TM Distance: >3 FB Neck ROM: Full    Dental no notable dental hx. (+) Teeth Intact   Pulmonary asthma , former smoker,    Pulmonary exam normal breath sounds clear to auscultation       Cardiovascular negative cardio ROS Normal cardiovascular exam Rhythm:Regular Rate:Normal     Neuro/Psych negative psych ROS   GI/Hepatic negative GI ROS, Neg liver ROS,   Endo/Other  diabetes, Well ControlledMorbid obesity  Renal/GU negative Renal ROS  negative genitourinary   Musculoskeletal Scoliosis- difficulty with placement of last epidural   Abdominal (+) + obese,   Peds  Hematology  (+) anemia ,   Anesthesia Other Findings dextroscoliosis of thoracolumbar spine  Reproductive/Obstetrics (+) Pregnancy                             Anesthesia Physical Anesthesia Plan  ASA: III  Anesthesia Plan: Epidural   Post-op Pain Management:    Induction:   PONV Risk Score and Plan:   Airway Management Planned: Natural Airway  Additional Equipment:   Intra-op Plan:   Post-operative Plan:   Informed Consent: I have reviewed the patients History and Physical, chart, labs and discussed the procedure including the risks, benefits and alternatives for the proposed anesthesia with the patient or authorized representative who has indicated his/her understanding and acceptance.     Plan Discussed with: Anesthesiologist  Anesthesia Plan Comments:         Anesthesia Quick Evaluation

## 2017-06-25 NOTE — Anesthesia Procedure Notes (Signed)
Epidural Patient location during procedure: OB Start time: 06/25/2017 12:56 PM  Staffing Anesthesiologist: Mal Amabile, MD Performed: anesthesiologist   Preanesthetic Checklist Completed: patient identified, site marked, surgical consent, pre-op evaluation, timeout performed, IV checked, risks and benefits discussed and monitors and equipment checked  Epidural Patient position: sitting Prep: site prepped and draped and DuraPrep Patient monitoring: continuous pulse ox and blood pressure Approach: midline Location: L3-L4 Injection technique: LOR air  Needle:  Needle type: Tuohy  Needle gauge: 17 G Needle length: 9 cm and 9 Needle insertion depth: 11.5 cm Catheter type: closed end flexible Catheter size: 19 Gauge Catheter at skin depth: 10 and 18 cm Test dose: negative and Other  Assessment Events: blood not aspirated, injection not painful, no injection resistance, negative IV test and no paresthesia  Additional Notes Patient identified. Risks and benefits discussed including failed block, incomplete  Pain control, post dural puncture headache, nerve damage, paralysis, blood pressure Changes, nausea, vomiting, reactions to medications-both toxic and allergic and post Partum back pain. All questions were answered. Patient expressed understanding and wished to proceed. Sterile technique was used throughout procedure. Epidural site was Dressed with sterile barrier dressing. No paresthesias, signs of intravascular injection Or signs of intrathecal spread were encountered. Attempt x 3. Difficult due to scoliosis. Patient was more comfortable after the epidural was dosed. Please see RN's note for documentation of vital signs and FHR which are stable.

## 2017-06-25 NOTE — Progress Notes (Addendum)
Subjective: Doing well. Pain well controlled with epidural.    Objective: BP (!) 134/91   Pulse 87   Temp 98 F (36.7 C)   Resp 18   Ht 6' (1.829 m)   Wt 132.4 kg (291 lb 12.8 oz)   LMP 09/25/2016   BMI 39.58 kg/m  No intake/output data recorded. No intake/output data recorded.  FHT:  FHR: 120 bpm, variability: moderate,  accelerations:  Present,  decelerations:  Absent UC:   regular, every 3-4 minutes SVE:   Dilation: 6.5 Effacement (%): 80 Station: -1 Exam by:: Dr. Primitivo Gauze  Labs: Lab Results  Component Value Date   WBC 7.3 06/25/2017   HGB 8.3 (L) 06/25/2017   HCT 26.3 (L) 06/25/2017   MCV 81.2 06/25/2017   PLT 274 06/25/2017    Assessment / Plan: Induction of labor due togestational diabetes, progressing well on pitocin  Labor:Progressing on Pitocin, s/p AROM at 1520 Preeclampsia:na Fetal Wellbeing:Category I Pain Control:epidural I/D:n/a Anticipated AVW:UJWJ Glucose: 89, 88 on last two q2 hour cbg   Myrene Buddy 06/25/2017, 3:26 PM

## 2017-06-25 NOTE — Progress Notes (Signed)
Subjective: Doing well with no issues. Sleeping when entered room.   Objective: BP (!) 103/54   Pulse 80   Temp 98 F (36.7 C) (Oral)   Resp 18   Ht 6' (1.829 m)   Wt 132.4 kg (291 lb 12.8 oz)   LMP 09/25/2016   BMI 39.58 kg/m  No intake/output data recorded. No intake/output data recorded.  FHT: Poor tracing,  FHR: 130 bpm, variability: moderate,  accelerations:  Abscent,  decelerations:  Absent UC:   irregular, every 10 minutes SVE:   Dilation: 3.5 Effacement (%): 70 Station: -2 Exam by:: Earlene Plater, RN   Labs: Lab Results  Component Value Date   WBC 7.3 06/25/2017   HGB 8.3 (L) 06/25/2017   HCT 26.3 (L) 06/25/2017   MCV 81.2 06/25/2017   PLT 274 06/25/2017    Assessment / Plan: Induction of labor due to gestational diabetes,  progressing well on pitocin  Labor: Progressing on Pitocin, will continue to increase then AROM. S/p Cytotec x2 Preeclampsia:  na Fetal Wellbeing:  Category I Pain Control:  anagesia prn I/D:  n/a Anticipated MOD:  NSVD  Glucose: 102, 122,89 on q2 hour blood sugar checks  Myrene Buddy 06/25/2017, 10:21 AM

## 2017-06-25 NOTE — Progress Notes (Signed)
Subjective: Very recently got epidural, feeling some contractions. Feeling a little nausea.   Objective: BP 129/67   Pulse 91   Temp 98 F (36.7 C)   Resp 18   Ht 6' (1.829 m)   Wt 132.4 kg (291 lb 12.8 oz)   LMP 09/25/2016   BMI 39.58 kg/m  No intake/output data recorded. No intake/output data recorded.  FHT:  FHR: 130 bpm, variability: moderate,  accelerations:  Present,  decelerations:  Absent UC:   regular, every 3-4 minutes SVE:   Dilation: 3.5 Effacement (%): 70 Station: -2 Exam by:: Earlene Plater, RN   Labs: Lab Results  Component Value Date   WBC 7.3 06/25/2017   HGB 8.3 (L) 06/25/2017   HCT 26.3 (L) 06/25/2017   MCV 81.2 06/25/2017   PLT 274 06/25/2017    Assessment / Plan: Induction of labor due to gestational diabetes,  progressing well on pitocin  Labor: Progressing on Pitocin, will continue to increase then AROM. S/p Cytotec x2 Preeclampsia:  na Fetal Wellbeing:  Category I Pain Control:  epidural I/D:  n/a Anticipated MOD:  NSVD  Glucose: 89, 88 on last two q2 hour cbg    Myrene Buddy 06/25/2017, 2:10 PM

## 2017-06-25 NOTE — Anesthesia Pain Management Evaluation Note (Signed)
  CRNA Pain Management Visit Note  Patient: Alexandria Beltran, 27 y.o., female  "Hello I am a member of the anesthesia team at [facility]. We have an anesthesia team available at all times to provide care throughout the hospital, including epidural management and anesthesia for C-section. I don't know your plan for the delivery whether it a natural birth, water birth, IV sedation, nitrous supplementation, doula or epidural, but we want to meet your pain goals."   1.Was your pain managed to your expectations on prior hospitalizations?   Yes   2.What is your expectation for pain management during this hospitalization?     Epidural  3.How can we help you reach that goal? support  Record the patient's initial score and the patient's pain goal.   Pain: 6  Pain Goal: 8 [facility] wants you to be able to say your pain was always managed very well.  Trellis Paganini 06/25/2017

## 2017-06-25 NOTE — H&P (Signed)
Alexandria Beltran is a 27 y.o. female G2P1001 with IUP at [redacted]w[redacted]d presenting for IOL for A2DM.   Prenatal History/Complications: SVD 7# 3 oz baby 11 months ago  EFW 56%  Past Medical History: Past Medical History:  Diagnosis Date  . Anemia   . Asthma    Inhaler used 07/22/16  . Gestational diabetes   . Scoliosis     Past Surgical History: Past Surgical History:  Procedure Laterality Date  . NO PAST SURGERIES      Obstetrical History: OB History    Gravida  2   Para  1   Term  1   Preterm      AB      Living  1     SAB      TAB      Ectopic      Multiple  0   Live Births  1           Social History: Social History   Socioeconomic History  . Marital status: Single    Spouse name: Not on file  . Number of children: Not on file  . Years of education: Not on file  . Highest education level: Not on file  Occupational History  . Not on file  Social Needs  . Financial resource strain: Not on file  . Food insecurity:    Worry: Not on file    Inability: Not on file  . Transportation needs:    Medical: Not on file    Non-medical: Not on file  Tobacco Use  . Smoking status: Former Smoker    Packs/day: 0.25    Years: 6.00    Pack years: 1.50    Types: Cigarettes    Last attempt to quit: 2018    Years since quitting: 1.3  . Smokeless tobacco: Never Used  Substance and Sexual Activity  . Alcohol use: No    Frequency: Never    Comment: occasionally (not with pregnancy)  . Drug use: No  . Sexual activity: Yes    Birth control/protection: None    Comment: undecided between two  Lifestyle  . Physical activity:    Days per week: Not on file    Minutes per session: Not on file  . Stress: Not on file  Relationships  . Social connections:    Talks on phone: Not on file    Gets together: Not on file    Attends religious service: Not on file    Active member of club or organization: Not on file    Attends meetings of clubs or organizations: Not on  file    Relationship status: Not on file  Other Topics Concern  . Not on file  Social History Narrative  . Not on file    Family History: Family History  Problem Relation Age of Onset  . Cancer Mother   . Diabetes Mother   . Alcohol abuse Mother   . Alcohol abuse Maternal Aunt   . Cancer Maternal Uncle   . Alcohol abuse Maternal Uncle     Allergies: No Known Allergies  Facility-Administered Medications Prior to Admission  Medication Dose Route Frequency Provider Last Rate Last Dose  . polyethylene glycol (MIRALAX / GLYCOLAX) packet 17 g  17 g Oral Daily Adam Phenix, MD       Medications Prior to Admission  Medication Sig Dispense Refill Last Dose  . ACCU-CHEK FASTCLIX LANCETS MISC 1 each by Does not apply route 4 (four) times daily.  100 each 10 Taking  . acetaminophen (TYLENOL) 500 MG tablet Take 1 tablet (500 mg total) by mouth every 6 (six) hours as needed. 30 tablet 0 Taking  . albuterol (PROVENTIL HFA;VENTOLIN HFA) 108 (90 Base) MCG/ACT inhaler Inhale 2 puffs into the lungs every 6 (six) hours as needed for wheezing or shortness of breath. 1 Inhaler 11 Taking  . cetirizine (ZYRTEC) 10 MG tablet Take 1 tablet (10 mg total) by mouth daily. 20 tablet 1   . docusate sodium (COLACE) 100 MG capsule Take 1 capsule (100 mg total) by mouth 2 (two) times daily as needed. 30 capsule 2   . FLUoxetine (PROZAC) 20 MG capsule Take 1 capsule (20 mg total) by mouth daily. 30 capsule 3 Taking  . glucose blood (ACCU-CHEK GUIDE) test strip 1 each by Other route 4 (four) times daily. Use as instructed 100 each 12 Taking  . glyBURIDE (DIABETA) 2.5 MG tablet Take 1 tablet (2.5 mg total) by mouth at bedtime. 30 tablet 3 Taking  . montelukast (SINGULAIR) 10 MG tablet Take 1 tablet (10 mg total) by mouth at bedtime. (Patient not taking: Reported on 06/07/2017) 30 tablet 3 Not Taking  . Prenatal Vit-Fe Fumarate-FA (PREPLUS) 27-1 MG TABS Take 1 tablet by mouth daily.  0 Taking  . triamcinolone  ointment (KENALOG) 0.5 % Apply 1 application topically 2 (two) times daily. 30 g 2 Taking        Review of Systems   Constitutional: Negative for fever and chills Eyes: Negative for visual disturbances Respiratory: Negative for shortness of breath, dyspnea Cardiovascular: Negative for chest pain or palpitations  Gastrointestinal: Negative for abdominal pain, vomiting, diarrhea and constipation.   Genitourinary: Negative for dysuria and urgency Musculoskeletal: Negative for back pain, joint pain, myalgias  Neurological: Negative for dizziness and headaches      Last menstrual period 09/25/2016, unknown if currently breastfeeding. General appearance: alert, cooperative and no distress Lungs: clear to auscultation bilaterally Heart: regular rate and rhythm Abdomen: soft, non-tender; bowel sounds normal Extremities: Homans sign is negative, no sign of DVT DTR's 2+ Presentation: cephalic Fetal monitoring  Baseline: 140 bpm, Variability: Good {> 6 bpm), Accelerations: Reactive and Decelerations: Absent Uterine activity  None      Prenatal labs: ABO, Rh: --/--/O POS, O POS (05/31 1610) Antibody: NEG (05/31 0808) Rubella: immune RPR: Nonreactive (01/03 0000)  HBsAg: Negative (11/29 0000)  HIV:    GBS: Negative (04/15 0936)   Prenatal Transfer Tool  Maternal Diabetes: Yes:  Diabetes Type:  Insulin/Medication controlled Genetic Screening: Declined Maternal Ultrasounds/Referrals: Normal Fetal Ultrasounds or other Referrals:  None Maternal Substance Abuse:  No Significant Maternal Medications:  Meds include: Other: g;ubiride Significant Maternal Lab Results: Lab values include: Group B Strep negative   Clinic  [facility] Prenatal Labs  Dating  LMP Blood type: --/--/O POS, O POS (05/31 9604)   Genetic Screen  Antibody:NEG (05/31 5409)  Anatomic [place] Normal Rubella:  immune  GTT Failed 3 hr  RPR: Nonreactive (01/03 0000)   Flu vaccine 01/23/17 HBsAg: Negative (11/29 0000)    TDaP vaccine  05/07/17                                             Rhogam: HIV:    Baby Food Breast  EAV:[id number] (05/04 0000)(For PCN allergy, check sensitivities)  Contraception IUD Pap:  Circumcision Yes    Pediatrician [facility] CF:  Support Person Alinda Money (FOB) SMA  Prenatal Classes   Hgb electrophoresis:    No results found for this or any previous visit (from the past 24 hour(s)).  Assessment: Alexandria Beltran is a 27 y.o. G2P1001 with an IUP at [redacted]w[redacted]d presenting for IOL for A2DM.  Plan: #Labor: Cytotec->Foley->pitocin #Pain:  Per request    Jacklyn Shell 06/25/2017, 12:17 AM

## 2017-06-25 NOTE — Progress Notes (Signed)
Patient ID: Alexandria Beltran, female   DOB: [birth year], 27 y.o.   MRN: [id number]  CTSP for FHR brady to nadir of high 80s x . Pt w/ rapid cx change to ant lip, 0 station. Pit stopped and O2 placed, IVF bolus and position changes. Recovered to 110-120s, IFSE in place. Cx now C/C/0 after foley bulb pushed above baby's head. Pt w/ epidural redose at approx 1730 and is very numb and not feeling ctx/pressure. Positioned on L side w/ peanut; will await urge to push as long as FHR stays stable. Anticipate SVD.  Cam Hai CNM 06/25/2017 6:34 PM

## 2017-06-25 NOTE — Progress Notes (Signed)
Alexandria Beltran is a 27 y.o. G2P1001 at [redacted]w[redacted]d by LMP admitted for induction of labor due to Diabetes.  Subjective: Patient is comfortable. Not feeling contractions at this time.   Objective: BP (!) 108/49   Pulse 100   Temp 98.1 F (36.7 C) (Oral)   Resp 18   Ht 6' (1.829 m)   Wt 132.4 kg (291 lb 12.8 oz)   LMP 09/25/2016   BMI 39.58 kg/m  No intake/output data recorded. No intake/output data recorded.  FHT:  FHR: 120 bpm, variability: moderate,  accelerations:  Present,  decelerations:  Absent UC:   Mild uterine irritability SVE:   Dilation: 2 Effacement (%): 60 Station: -3 Exam by:: Mervin Kung, RN  Labs: Lab Results  Component Value Date   WBC 7.3 06/25/2017   HGB 8.3 (L) 06/25/2017   HCT 26.3 (L) 06/25/2017   MCV 81.2 06/25/2017   PLT 274 06/25/2017    Assessment / Plan: Admitted for induction of labor secondary to A2 GDM  Labor: Currently undergoing cervical ripening with cytotec. Cytotec x2 with last dose around 0500. Consider transitioning to pitocin at next check if cervical exam continues to progress.    A2GDM: Patient is on glyburide 2.5mg  daily at home. Glucose has been between 102 and 122. Continue to monitor with q4hr glucose checks. May require being placed on glucose stabilizer if continues to rise with next glucose check.   Preeclampsia:  no signs or symptoms of toxicity Fetal Wellbeing:  Category I Pain Control:  Labor support without medications I/D:  n/a; GBS negative. No indication for antibiotics.  Anticipated MOD:  NSVD  Gorden Harms 06/25/2017, 5:58 AM

## 2017-06-26 ENCOUNTER — Other Ambulatory Visit: Payer: Self-pay

## 2017-06-26 DIAGNOSIS — D649 Anemia, unspecified: Secondary | ICD-10-CM | POA: Diagnosis present

## 2017-06-26 LAB — GLUCOSE, CAPILLARY: Glucose-Capillary: 115 mg/dL — ABNORMAL HIGH (ref 65–99)

## 2017-06-26 MED ORDER — FERROUS FUMARATE 324 (106 FE) MG PO TABS
1.0000 | ORAL_TABLET | Freq: Two times a day (BID) | ORAL | Status: DC
  Administered 2017-06-26 – 2017-06-27 (×3): 106 mg via ORAL
  Filled 2017-06-26 (×5): qty 1

## 2017-06-26 NOTE — Progress Notes (Addendum)
Post Partum Day #1 Subjective: no complaints, up ad lib and tolerating PO; breastfeeding going well; plans IUD for pp contraception  Objective: Blood pressure (!) 108/58, pulse 81, temperature 98 F (36.7 C), temperature source Oral, resp. rate 18, height 6' (1.829 m), weight 132.4 kg (291 lb 12.8 oz), last menstrual period 09/25/2016, unknown if currently breastfeeding.  Physical Exam:  General: alert, cooperative and no distress Lochia: appropriate Uterine Fundus: firm DVT Evaluation: No evidence of DVT seen on physical exam.  Recent Labs    06/25/17 0045  HGB 8.3*  HCT 26.3*   FBS this am: 115 (pt had candy in the middle of the night)  Assessment/Plan: Plan for discharge tomorrow  Add FeSO4 bid No need for more CBGs- plan on 2hr GTT at pp visit   LOS: 1 day   Tahiry Spicer CNM 06/26/2017, 9:08 AM

## 2017-06-26 NOTE — Lactation Note (Signed)
This note was copied from a baby's chart. Lactation Consultation Note Baby 5 hrs old. Mom states baby has latched well so far. Mom didn't Bf her now 35 month old wouldn't latch. Newborn behavior, STS, I&O, cluster feeding, supply and demand reviewed. Mom encouraged to feed baby 8-12 times/24 hours and with feeding cues. Tandem feeding discussed.  Mom has long pendulum large breast. Everted short shaft nipples. Hand expression w/easy flow of colostrum. Mom excited to see colostrum.  Encouraged to call for assistance or questions. WH/LC brochure given w/resources, support groups and LC services.  Patient Name: Alexandria Beltran [id number] Date: 06/26/2017 Reason for consult: Initial assessment   Maternal Data Has patient been taught Hand Expression?: Yes Does the patient have breastfeeding experience prior to this delivery?: No  Feeding Feeding Type: Breast Fed Length of feed: 10 min  LATCH Score Latch: Grasps breast easily, tongue down, lips flanged, rhythmical sucking.  Audible Swallowing: Spontaneous and intermittent  Type of Nipple: Everted at rest and after stimulation  Comfort (Breast/Nipple): Soft / non-tender  Hold (Positioning): No assistance needed to correctly position infant at breast.  LATCH Score: 10  Interventions Interventions: Breast feeding basics reviewed;Skin to skin;Breast massage;Hand express;Breast compression;Position options;Support pillows  Lactation Tools Discussed/Used WIC Program: Yes   Consult Status Consult Status: Follow-up Date: 06/26/17 Follow-up type: In-patient    Alexandria Beltran, Diamond Nickel 06/26/2017, 1:31 AM
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
## 2017-06-26 NOTE — Anesthesia Postprocedure Evaluation (Signed)
Anesthesia Post Note  Patient: Alexandria Beltran  Procedure(s) Performed: AN AD HOC LABOR EPIDURAL     Patient location during evaluation: Mother Baby Anesthesia Type: Epidural Level of consciousness: awake and alert and oriented Pain management: satisfactory to patient Vital Signs Assessment: post-procedure vital signs reviewed and stable Respiratory status: respiratory function stable Cardiovascular status: stable Postop Assessment: no headache, no backache, epidural receding, patient able to bend at knees, no signs of nausea or vomiting and adequate PO intake Anesthetic complications: no    Last Vitals:  Vitals:   06/26/17 0400 06/26/17 0553  BP: 140/79 (!) 108/58  Pulse: 99 81  Resp: 18 18  Temp: 37.2 C 36.7 C    Last Pain:  Vitals:   06/26/17 0553  TempSrc: Oral  PainSc:    Pain Goal:                 [name]

## 2017-06-27 MED ORDER — IBUPROFEN 600 MG PO TABS: 600.0000 mg | ORAL_TABLET | Freq: Four times a day (QID) | ORAL | 0 refills | Status: DC

## 2017-06-27 NOTE — Discharge Summary (Signed)
OB Discharge Summary     Patient Name: Alexandria Beltran DOB: [birth year] MRN: [id number]  Date of admission: 06/25/2017 Delivering MD: Myrene Buddy   Date of discharge: 06/27/2017  Admitting diagnosis: INDUCTION Intrauterine pregnancy: [redacted]w[redacted]d     Secondary diagnosis:  Active Problems:   Supervision of high risk pregnancy, antepartum   Asthma affecting pregnancy in third trimester   GDM, class A2   Gestational diabetes   NSVD (normal spontaneous vaginal delivery)   Anemia  Additional problems: none     Discharge diagnosis: Term Pregnancy Delivered                                                                                                Post partum procedures:none  Augmentation: AROM, Pitocin and Cytotec  Complications: None  Hospital course:  Onset of Labor With Vaginal Delivery     27 y.o. yo G2P2002 at [redacted]w[redacted]d was admitted in For Induction of Labor on 06/25/2017. Patient had an uncomplicated labor course as follows:  Membrane Rupture Time/Date: 3:17 PM ,06/25/2017   Intrapartum Procedures: Episiotomy: None [1]                                         Lacerations:  1st degree [2];Perineal [11]   She was augmented and induced with Cytotec, Foley and Pitocin  Patient had a delivery of a Viable infant. 06/25/2017  Information for the patient's newborn:  Adison, Reifsteck [[id number]]  Delivery Method: Vag-Spont    Pateint had an uncomplicated postpartum course.  She is ambulating, tolerating a regular diet, passing flatus, and urinating well. Patient is discharged home in stable condition on 06/27/17.   Physical exam  Vitals:   06/26/17 0553 06/26/17 1218 06/26/17 1756 06/27/17 0611  BP: (!) 108/58 110/63 (!) 131/55 128/77  Pulse: 81 88 91 75  Resp: Temp: 98 F (36.7 C) 98 F (36.7 C) 98.8 F (37.1 C) 98 F (36.7 C)  TempSrc: Oral Oral Oral Oral  SpO2:  99%    Weight:      Height:       General: alert, cooperative and no distress Lochia:  appropriate Uterine Fundus: firm Incision: N/A DVT Evaluation: No evidence of DVT seen on physical exam. Labs: Lab Results  Component Value Date   WBC 7.3 06/25/2017   HGB 8.3 (L) 06/25/2017   HCT 26.3 (L) 06/25/2017   MCV 81.2 06/25/2017   PLT 274 06/25/2017   CMP Latest Ref Rng & Units 07/24/2016  Glucose 65 - 99 mg/dL 97  BUN 6 - 20 mg/dL <4(W)  Creatinine 1.02 - 1.00 mg/dL 7.25  Sodium 366 - 440 mmol/L 135  Potassium 3.5 - 5.1 mmol/L 3.9  Chloride 101 - 111 mmol/L 102  CO2 22 - 32 mmol/L 24  Calcium 8.9 - 10.3 mg/dL 3.4(V)  Total Protein 6.5 - 8.1 g/dL 6.7  Total Bilirubin 0.3 - 1.2 mg/dL 4.2(V)  Alkaline Phos 38 - 126 U/L 70  AST 15 - 41  U/L 17  ALT 14 - 54 U/L 11(L)    Discharge instruction: per After Visit Summary and "Baby and Me Booklet".  After visit meds:  Allergies as of 06/27/2017      Reactions   Pollen Extract       Medication List    STOP taking these medications   glyBURIDE 2.5 MG tablet Commonly known as:  DIABETA     TAKE these medications   ACCU-CHEK FASTCLIX LANCETS Misc 1 each by Does not apply route 4 (four) times daily.   acetaminophen 500 MG tablet Commonly known as:  TYLENOL Take 1 tablet (500 mg total) by mouth every 6 (six) hours as needed.   albuterol 108 (90 Base) MCG/ACT inhaler Commonly known as:  PROVENTIL HFA;VENTOLIN HFA Inhale 2 puffs into the lungs every 6 (six) hours as needed for wheezing or shortness of breath.   cetirizine 10 MG tablet Commonly known as:  ZYRTEC Take 1 tablet (10 mg total) by mouth daily.   docusate sodium 100 MG capsule Commonly known as:  COLACE Take 1 capsule (100 mg total) by mouth 2 (two) times daily as needed.   FLUoxetine 20 MG capsule Commonly known as:  PROZAC Take 1 capsule (20 mg total) by mouth daily.   glucose blood test strip Commonly known as:  ACCU-CHEK GUIDE 1 each by Other route 4 (four) times daily. Use as instructed   ibuprofen 600 MG tablet Commonly known as:   ADVIL,MOTRIN Take 1 tablet (600 mg total) by mouth every 6 (six) hours.   montelukast 10 MG tablet Commonly known as:  SINGULAIR Take 1 tablet (10 mg total) by mouth at bedtime.   PREPLUS 27-1 MG Tabs Take 1 tablet by mouth daily.   triamcinolone ointment 0.5 % Commonly known as:  KENALOG Apply 1 application topically 2 (two) times daily.       Diet: routine diet  Activity: Advance as tolerated. Pelvic rest for 6 weeks.   Outpatient follow up:4 weeks Follow up Appt: Future Appointments  Date Time Provider Department Center  07/26/2017  9:15 AM Armando Reichert, CNM [facility]  07/26/2017 10:30 AM [facility]  07/29/2017  9:20 AM Massie Maroon, FNP [facility] None   Follow up Visit:No follow-ups on file.  Postpartum contraception: IUD Mirena  Newborn Data: Live born female  Birth Weight: 7 lb 4.4 oz (3300 g) APGAR: 8, 9  Newborn Delivery   Birth date/time:  06/25/2017 19:51:00 Delivery type:  Vaginal, Spontaneous     Baby Feeding: Breast Disposition:home with mother   06/27/2017 Wynelle Bourgeois, CNM

## 2017-06-27 NOTE — Discharge Instructions (Signed)
Postpartum Care After Vaginal Delivery °The period of time right after you deliver your newborn is called the postpartum period. °What kind of medical care will I receive? °· You may continue to receive fluids and medicines through an IV tube inserted into one of your veins. °· If an incision was made near your vagina (episiotomy) or if you had some vaginal tearing during delivery, cold compresses may be placed on your episiotomy or your tear. This helps to reduce pain and swelling. °· You may be given a squirt bottle to use when you go to the bathroom. You may use this until you are comfortable wiping as usual. To use the squirt bottle, follow these steps: °? Before you urinate, fill the squirt bottle with warm water. Do not use hot water. °? After you urinate, while you are sitting on the toilet, use the squirt bottle to rinse the area around your urethra and vaginal opening. This rinses away any urine and blood. °? You may do this instead of wiping. As you start healing, you may use the squirt bottle before wiping yourself. Make sure to wipe gently. °? Fill the squirt bottle with clean water every time you use the bathroom. °· You will be given sanitary pads to wear. °How can I expect to feel? °· You may not feel the need to urinate for several hours after delivery. °· You will have some soreness and pain in your abdomen and vagina. °· If you are breastfeeding, you may have uterine contractions every time you breastfeed for up to several weeks postpartum. Uterine contractions help your uterus return to its normal size. °· It is normal to have vaginal bleeding (lochia) after delivery. The amount and appearance of lochia is often similar to a menstrual period in the first week after delivery. It will gradually decrease over the next few weeks to a dry, yellow-brown discharge. For most women, lochia stops completely by 6-8 weeks after delivery. Vaginal bleeding can vary from woman to woman. °· Within the first few  days after delivery, you may have breast engorgement. This is when your breasts feel heavy, full, and uncomfortable. Your breasts may also throb and feel hard, tightly stretched, warm, and tender. After this occurs, you may have milk leaking from your breasts. Your health care provider can help you relieve discomfort due to breast engorgement. Breast engorgement should go away within a few days. °· You may feel more sad or worried than normal due to hormonal changes after delivery. These feelings should not last more than a few days. If these feelings do not go away after several days, speak with your health care provider. °How should I care for myself? °· Tell your health care provider if you have pain or discomfort. °· Drink enough water to keep your urine clear or pale yellow. °· Wash your hands thoroughly with soap and water for at least 20 seconds after changing your sanitary pads, after using the toilet, and before holding or feeding your baby. °· If you are not breastfeeding, avoid touching your breasts a lot. Doing this can make your breasts produce more milk. °· If you become weak or lightheaded, or you feel like you might faint, ask for help before: °? Getting out of bed. °? Showering. °· Change your sanitary pads frequently. Watch for any changes in your flow, such as a sudden increase in volume, a change in color, the passing of large blood clots. If you pass a blood clot from your vagina, save it   to show to your health care provider. Do not flush blood clots down the toilet without having your health care provider look at them. °· Make sure that all your vaccinations are up to date. This can help protect you and your baby from getting certain diseases. You may need to have immunizations done before you leave the hospital. °· If desired, talk with your health care provider about methods of family planning or birth control (contraception). °How can I start bonding with my baby? °Spending as much time as  possible with your baby is very important. During this time, you and your baby can get to know each other and develop a bond. Having your baby stay with you in your room (rooming in) can give you time to get to know your baby. Rooming in can also help you become comfortable caring for your baby. Breastfeeding can also help you bond with your baby. °How can I plan for returning home with my baby? °· Make sure that you have a car seat installed in your vehicle. °? Your car seat should be checked by a certified car seat installer to make sure that it is installed safely. °? Make sure that your baby fits into the car seat safely. °· Ask your health care provider any questions you have about caring for yourself or your baby. Make sure that you are able to contact your health care provider with any questions after leaving the hospital. °This information is not intended to replace advice given to you by your health care provider. Make sure you discuss any questions you have with your health care provider. °Document Released: 12/07/2006 Document Revised: 07/15/2015 Document Reviewed: 01/14/2015 °Elsevier Interactive Patient Education © 2018 Elsevier Inc. ° °

## 2017-06-27 NOTE — Lactation Note (Signed)
This note was copied from a baby's chart. Lactation Consultation Note  Patient Name: Alexandria Beltran ZOXWR'[id number] Date: 06/27/2017 Reason for consult: Follow-up assessment;Difficult latch  Visited with Mom on day of discharge, baby 40 hrs old and at 6% weight loss.  Mom stating she has to work hard to latch baby.  Offered to assist as baby was cueing in her arms.  Mom with large, heavy breasts, and erect nipples.   Placed rolled up cloth diaper under breasts, as Mom was struggling to lift breasts and be able to see baby's latch.  Mom also pushing her nipple into baby's mouth while he was tongue sucking.  Mom also concerned about breathing when baby on the breast, and Mom pulling breast away from baby's nose. Baby taken off with instructions, repositioned Mom and baby.  Hand expressed colostrum easily.   Rolled up cloth diaper used under breast to support weight of breast.  Demonstrated and assisted how to bring baby to breast when he opens widely.  Baby able to attain a latch deeply onto areola.  Mom unable to see latch, but Mom can tell when baby is latched by the way it feels.  Showed Mom how to tilt baby's head to pull chin in closer than nose.  If Mom wants to make breathing space, recommended pressing down on breast, not pulling breast tissue away from baby's nose.  Hand pump given with instructions on use and cleaning of pump parts.    Encouraged Mom to keep baby STS and feed him often on cue.  Goal of >8 feedings per 24 hrs.  Engorgement prevention and treatment discussed.  Mom aware of OP lactation support available, and encouraged her to call prn.  Feeding Feeding Type: Breast Fed  LATCH Score Latch: Grasps breast easily, tongue down, lips flanged, rhythmical sucking.  Audible Swallowing: Spontaneous and intermittent  Type of Nipple: Everted at rest and after stimulation  Comfort (Breast/Nipple): Soft / non-tender  Hold (Positioning): Assistance needed to correctly position infant  at breast and maintain latch.  LATCH Score: 9  Interventions Interventions: Breast feeding basics reviewed;Assisted with latch;Skin to skin;Breast massage;Hand express;Support pillows;Adjust position;Breast compression;Position options;Expressed milk;Coconut oil;Hand pump  Lactation Tools Discussed/Used Tools: Pump Breast pump type: Manual   Consult Status Consult Status: Complete Date: 06/27/17 Follow-up type: Call as needed    Judee Clara 06/27/2017, 12:03 PM

## 2017-06-27 NOTE — Progress Notes (Signed)
Discharge instructions given to patient and she verbalized understanding of all instructions given. Written copy of AVS given to patient. 

## 2017-07-23 ENCOUNTER — Other Ambulatory Visit: Payer: Self-pay | Admitting: General Practice

## 2017-07-23 DIAGNOSIS — O24410 Gestational diabetes mellitus in pregnancy, diet controlled: Secondary | ICD-10-CM

## 2017-07-26 ENCOUNTER — Ambulatory Visit: Payer: Self-pay | Admitting: Advanced Practice Midwife

## 2017-07-26 ENCOUNTER — Ambulatory Visit (INDEPENDENT_AMBULATORY_CARE_PROVIDER_SITE_OTHER): Payer: Medicaid Other

## 2017-07-26 ENCOUNTER — Other Ambulatory Visit: Payer: Self-pay

## 2017-07-26 VITALS — BP 120/88 | HR 86 | Wt 271.0 lb

## 2017-07-26 DIAGNOSIS — Z1389 Encounter for screening for other disorder: Secondary | ICD-10-CM | POA: Diagnosis not present

## 2017-07-26 DIAGNOSIS — Z30430 Encounter for insertion of intrauterine contraceptive device: Secondary | ICD-10-CM | POA: Diagnosis not present

## 2017-07-26 MED ORDER — LEVONORGESTREL 19.5 MCG/DAY IU IUD
INTRAUTERINE_SYSTEM | Freq: Once | INTRAUTERINE | Status: AC
Start: 2017-07-26 — End: 2017-07-26
  Administered 2017-07-26: 17:00:00 via INTRAUTERINE

## 2017-07-26 NOTE — Progress Notes (Signed)
Subjective:     Alexandria Beltran is a 27 y.o. female who presents for a postpartum visit. She is 4 weeks 3 days postpartum following a spontaneous vaginal delivery. I have fully reviewed the prenatal and intrapartum course. The delivery was at 39 gestational weeks. Outcome: spontaneous vaginal delivery. Anesthesia: epidural. Postpartum course has been uncomplicated. Baby's course has been uncomplicated. Baby is feeding by both breast and bottle - gerber formula. Bleeding staining only. Bowel function is normal. Bladder function is normal. Patient is not sexually active. Contraception method is IUD. Postpartum depression screening: negative.  The following portions of the patient's history were reviewed and updated as appropriate: allergies, current medications, past family history, past medical history, past social history, past surgical history and problem list.  Review of Systems Pertinent items noted in HPI and remainder of comprehensive ROS otherwise negative.   Objective:    There were no vitals taken for this visit.  General:  alert, cooperative and no distress   Breasts:  inspection negative, no nipple discharge or bleeding, no masses or nodularity palpable  Lungs: clear to auscultation bilaterally  Heart:  regular rate and rhythm, S1, S2 normal, no murmur, click, rub or gallop  Abdomen: soft, non-tender; bowel sounds normal; no masses,  no organomegaly   Vulva:  normal  Vagina: normal vagina  Cervix:  normal  Corpus: normal size, contour, position, consistency, mobility, non-tender  Adnexa:  normal adnexa  Rectal Exam: Not performed.        IUD Insertion Procedure Note Patient identified, informed consent performed, consent signed.   Discussed risks of irregular bleeding, cramping, infection, malpositioning or misplacement of the IUD outside the uterus which may require further procedure such as laparoscopy. Time out was performed.  Urine pregnancy test negative.  Speculum placed in  the vagina.  Cervix visualized.  Cleaned with Betadine x 2.  Grasped anteriorly with a single tooth tenaculum.  Uterus sounded to 8 cm.  Liletta IUD placed per manufacturer's recommendations.  Strings trimmed to 3 cm. Tenaculum was removed, good hemostasis noted.  Patient tolerated procedure well.   Patient was given post-procedure instructions.  She was advised to have backup contraception for one week.  Patient was also asked to check IUD strings periodically and follow up in 4 weeks for IUD check.  Assessment:     Normal postpartum exam. Pap smear not done at today's visit. Last pap 01/2016, normal.  Plan:    1. Contraception: IUD 2. Offered patient to see Asher MuirJamie again and patient declines. 3. Follow up in: 4 weeks or as needed for 2hr GTT and IUD string check   Alexandria Beltran, CNM 07/26/17 5:09 PM
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
## 2017-07-26 NOTE — Patient Instructions (Signed)
Intrauterine Device Information An intrauterine device (IUD) is inserted into your uterus to prevent pregnancy. There are two types of IUDs available:  Copper IUD-This type of IUD is wrapped in copper wire and is placed inside the uterus. Copper makes the uterus and fallopian tubes produce a fluid that kills sperm. The copper IUD can stay in place for 10 years.  Hormone IUD-This type of IUD contains the hormone progestin (synthetic progesterone). The hormone thickens the cervical mucus and prevents sperm from entering the uterus. It also thins the uterine lining to prevent implantation of a fertilized egg. The hormone can weaken or kill the sperm that get into the uterus. One type of hormone IUD can stay in place for 5 years, and another type can stay in place for 3 years.  Your health care provider will make sure you are a good candidate for a contraceptive IUD. Discuss with your health care provider the possible side effects. Advantages of an intrauterine device  IUDs are highly effective, reversible, long acting, and low maintenance.  There are no estrogen-related side effects.  An IUD can be used when breastfeeding.  IUDs are not associated with weight gain.  The copper IUD works immediately after insertion.  The hormone IUD works right away if inserted within 7 days of your period starting. You will need to use a backup method of birth control for 7 days if the hormone IUD is inserted at any other time in your cycle.  The copper IUD does not interfere with your female hormones.  The hormone IUD can make heavy menstrual periods lighter and decrease cramping.  The hormone IUD can be used for 3 or 5 years.  The copper IUD can be used for 10 years. Disadvantages of an intrauterine device  The hormone IUD can be associated with irregular bleeding patterns.  The copper IUD can make your menstrual flow heavier and more painful.  You may experience cramping and vaginal bleeding after  insertion. This information is not intended to replace advice given to you by your health care provider. Make sure you discuss any questions you have with your health care provider. Document Released: 01/14/2004 Document Revised: 07/18/2015 Document Reviewed: 07/31/2012 Elsevier Interactive Patient Education  2017 [employer] IUD PLACEMENT POST-PROCEDURE INSTRUCTIONS  1. You may take Ibuprofen, Aleve or Tylenol for pain if needed.  Cramping should resolve within in 24 hours.  2. You may have a small amount of spotting.  You should wear a mini pad for the next few days.  3. You may have intercourse after 24 hours.  If you using this for birth control, it is effective immediately.  4. You need to call if you have any pelvic pain, fever, heavy bleeding or foul smelling vaginal discharge.  Irregular bleeding is common the first several months after having an IUD placed. You do not need to call for this reason unless you are concerned.  5. Shower or bathe as normal  6. You should have a follow-up appointment in 4-8 weeks for a re-check to make sure you are not having any problems. 

## 2017-07-28 ENCOUNTER — Encounter: Payer: Self-pay | Admitting: *Deleted

## 2017-07-29 ENCOUNTER — Ambulatory Visit: Payer: Medicaid Other | Admitting: Family Medicine

## 2017-07-30 ENCOUNTER — Other Ambulatory Visit: Payer: Self-pay

## 2017-08-23 ENCOUNTER — Other Ambulatory Visit: Payer: Self-pay

## 2017-08-23 ENCOUNTER — Encounter: Payer: Self-pay | Admitting: *Deleted

## 2017-08-23 ENCOUNTER — Telehealth: Payer: Self-pay | Admitting: *Deleted

## 2017-08-23 ENCOUNTER — Ambulatory Visit: Payer: Self-pay | Admitting: Advanced Practice Midwife

## 2017-08-23 NOTE — Telephone Encounter (Signed)
Alexandria Beltran did not keep her scheduled appointment for IUD follow up and fasting 2 hr gtt. Per discussion with Thressa ShellerHeather Hogan, CNM I called and call did not go thru. Will send letter.

## 2017-09-01 ENCOUNTER — Encounter: Payer: Self-pay | Admitting: Family Medicine

## 2017-09-01 ENCOUNTER — Ambulatory Visit (INDEPENDENT_AMBULATORY_CARE_PROVIDER_SITE_OTHER): Payer: Medicaid Other | Admitting: Family Medicine

## 2017-09-01 VITALS — BP 142/88 | HR 80 | Ht 72.0 in | Wt 276.0 lb

## 2017-09-01 DIAGNOSIS — S39012A Strain of muscle, fascia and tendon of lower back, initial encounter: Secondary | ICD-10-CM | POA: Diagnosis not present

## 2017-09-01 DIAGNOSIS — I10 Essential (primary) hypertension: Secondary | ICD-10-CM

## 2017-09-01 DIAGNOSIS — M549 Dorsalgia, unspecified: Secondary | ICD-10-CM

## 2017-09-01 DIAGNOSIS — R8290 Unspecified abnormal findings in urine: Secondary | ICD-10-CM

## 2017-09-01 DIAGNOSIS — Z131 Encounter for screening for diabetes mellitus: Secondary | ICD-10-CM | POA: Diagnosis not present

## 2017-09-01 DIAGNOSIS — Z09 Encounter for follow-up examination after completed treatment for conditions other than malignant neoplasm: Secondary | ICD-10-CM

## 2017-09-01 LAB — POCT URINALYSIS DIP (MANUAL ENTRY)
Bilirubin, UA: NEGATIVE
Glucose, UA: NEGATIVE mg/dL
Ketones, POC UA: NEGATIVE mg/dL
Leukocytes, UA: NEGATIVE
Nitrite, UA: NEGATIVE
Protein Ur, POC: NEGATIVE mg/dL
Spec Grav, UA: 1.02 (ref 1.010–1.025)
Urobilinogen, UA: 1 E.U./dL
pH, UA: 6.5 (ref 5.0–8.0)

## 2017-09-01 LAB — POCT GLYCOSYLATED HEMOGLOBIN (HGB A1C): Hemoglobin A1C: 5.2 % (ref 4.0–5.6)

## 2017-09-01 MED ORDER — TRAMADOL HCL 50 MG PO TABS: 50.0000 mg | ORAL_TABLET | Freq: Three times a day (TID) | ORAL | 0 refills | Status: DC | PRN

## 2017-09-01 MED ORDER — NAPROXEN 500 MG PO TABS: 500.0000 mg | ORAL_TABLET | Freq: Two times a day (BID) | ORAL | 1 refills | Status: DC

## 2017-09-01 MED ORDER — KETOROLAC TROMETHAMINE 60 MG/2ML IM SOLN
60.0000 mg | Freq: Once | INTRAMUSCULAR | Status: AC
Start: 2017-09-01 — End: 2017-09-01
  Administered 2017-09-01: 60 mg via INTRAMUSCULAR

## 2017-09-01 NOTE — Progress Notes (Signed)
Follow Up Appointment   Subjective:    Patient ID: Alexandria Beltran, female    DOB: 1991-01-04, 27 y.o.   MRN: [id number]  PCP: Raliegh Ip, NP  No chief complaint on file.  HPI  Ms. Dicenso has a past medical history of Scoliosis, Diabetes, Asthma, and Anemia. She is here today for follow up.   Current Status: Since her last office visit, she is doing well with no complaints. She denies fevers, chills, fatigue, recent infections, weight loss, and night sweats. She has not had any headaches, visual changes, dizziness, and falls. No chest pain, heart palpitations, cough and shortness of breath reported. No reports of GI problems such as nausea, vomiting, diarrhea, and constipation. She has no reports of blood in stools, dysuria and hematuria. No depression or anxiety. She has back and hip pain, which she takes Acetaminophen and Ibuprofen.   Past Medical History:  Diagnosis Date  . Anemia   . Asthma    Inhaler used 07/22/16  . Gestational diabetes   . Scoliosis     Family History  Problem Relation Age of Onset  . Cancer Mother   . Diabetes Mother   . Alcohol abuse Mother   . Alcohol abuse Maternal Aunt   . Cancer Maternal Uncle   . Alcohol abuse Maternal Uncle     Social History   Socioeconomic History  . Marital status: Single    Spouse name: Not on file  . Number of children: Not on file  . Years of education: Not on file  . Highest education level: Not on file  Occupational History  . Not on file  Social Needs  . Financial resource strain: Not on file  . Food insecurity:    Worry: Not on file    Inability: Not on file  . Transportation needs:    Medical: Not on file    Non-medical: Not on file  Tobacco Use  . Smoking status: Former Smoker    Packs/day: 0.25    Years: 6.00    Pack years: 1.50    Types: Cigarettes    Last attempt to quit: 2018    Years since quitting: 1.5  . Smokeless tobacco: Never Used  Substance and Sexual Activity  . Alcohol use: No    Frequency: Never    Comment: occasionally (not with pregnancy)  . Drug use: No  . Sexual activity: Yes    Birth control/protection: None    Comment: undecided between two  Lifestyle  . Physical activity:    Days per week: Not on file    Minutes per session: Not on file  . Stress: Not on file  Relationships  . Social connections:    Talks on phone: Not on file    Gets together: Not on file    Attends religious service: Not on file    Active member of club or organization: Not on file    Attends meetings of clubs or organizations: Not on file    Relationship status: Not on file  . Intimate partner violence:    Fear of current or ex partner: Not on file    Emotionally abused: Not on file    Physically abused: Not on file    Forced sexual activity: Not on file  Other Topics Concern  . Not on file  Social History Narrative  . Not on file    Past Surgical History:  Procedure Laterality Date  . NO PAST SURGERIES     Immunization History  Administered  Date(s) Administered  . PPD Test 08/10/2017  . Tdap 05/07/2017    Current Meds  Medication Sig  . ACCU-CHEK FASTCLIX LANCETS MISC 1 each by Does not apply route 4 (four) times daily.  Marland Kitchen. albuterol (PROVENTIL HFA;VENTOLIN HFA) 108 (90 Base) MCG/ACT inhaler Inhale 2 puffs into the lungs every 6 (six) hours as needed for wheezing or shortness of breath.  Marland Kitchen. glucose blood (ACCU-CHEK GUIDE) test strip 1 each by Other route 4 (four) times daily. Use as instructed  . triamcinolone ointment (KENALOG) 0.5 % Apply 1 application topically 2 (two) times daily.   Current Facility-Administered Medications for the 09/01/17 encounter (Office Visit) with Kallie LocksStroud, Chaslyn Eisen M, FNP  Medication  . polyethylene glycol (MIRALAX / GLYCOLAX) packet 17 g   Allergies  Allergen Reactions  . Bee Pollen   . Pollen Extract     BP (!) 142/88 (BP Location: Left Arm, Patient Position: Sitting, Cuff Size: Large)   Pulse 80   Ht 6' (1.829 m)   Wt 276 lb  (125.2 kg)   LMP 07/24/2017   SpO2 98%   BMI 37.43 kg/m   Review of Systems  Constitutional: Negative.   HENT: Negative.   Eyes: Negative.   Respiratory: Negative.   Cardiovascular: Negative.   Gastrointestinal: Negative.   Endocrine: Negative.   Genitourinary: Negative.   Musculoskeletal: Positive for arthralgias (hip ) and back pain.  Skin: Negative.   Allergic/Immunologic: Negative.   Neurological: Negative.   Hematological: Negative.   Psychiatric/Behavioral: Negative.    Objective:   Physical Exam  Constitutional: She is oriented to person, place, and time. She appears well-developed and well-nourished.  HENT:  Head: Normocephalic and atraumatic.  Right Ear: External ear normal.  Left Ear: External ear normal.  Nose: Nose normal.  Mouth/Throat: Oropharynx is clear and moist.  Eyes: Pupils are equal, round, and reactive to light. Conjunctivae are normal.  Neck: Normal range of motion. Neck supple.  Cardiovascular: Normal rate, regular rhythm, normal heart sounds and intact distal pulses.  Pulmonary/Chest: Effort normal and breath sounds normal.  Abdominal: Soft. Bowel sounds are normal.  Musculoskeletal: Normal range of motion.  Neurological: She is alert and oriented to person, place, and time.  Skin: Skin is warm and dry. Capillary refill takes less than 2 seconds.  Psychiatric: She has a normal mood and affect. Her behavior is normal. Judgment and thought content normal.  Nursing note and vitals reviewed.  Assessment & Plan:   1. Hypertension, unspecified type Blood pressure is stable at 142/88 today. She will continue to decrease high sodium intake, excessive alcohol intake, increase potassium intake, smoking cessation, and increase physical activity of at least 30 minutes of cardio activity daily. She will continue to follow Heart Healthy or DASH diet.  - POCT urinalysis dipstick  2. Screening for diabetes mellitus Hgb A1c is stable at 5.2. She will continue  to decrease foods/beverages high in sugars and carbs and follow Heart Healthy or DASH diet. Increase physical activity to at least 30 minutes cardio exercise daily.   - POCT glycosylated hemoglobin (Hb A1C)  3. Back pain, unspecified back location, unspecified back pain laterality, unspecified chronicity  4. Back strain, initial encounter - naproxen (NAPROSYN) 500 MG tablet; Take 1 tablet (500 mg total) by mouth 2 (two) times daily with a meal.  Dispense: 30 tablet; Refill: 1 - traMADol (ULTRAM) 50 MG tablet; Take 1 tablet (50 mg total) by mouth every 8 (eight) hours as needed.  Dispense: 30 tablet; Refill: 0 - ketorolac (  TORADOL) injection 60 mg  5. Abnormal urinalysis - Urine Culture  6. Follow up She will follow up in 1 month.   Raliegh Ip,  MSN, FNP-C [facility] [address] [place], [place] [address] [phone]

## 2017-09-01 NOTE — Patient Instructions (Signed)
Tramadol tablets What is this medicine? TRAMADOL (TRA ma dole) is a pain reliever. It is used to treat moderate to severe pain in adults. This medicine may be used for other purposes; ask your health care provider or pharmacist if you have questions. COMMON BRAND NAME(S): Ultram What should I tell my health care provider before I take this medicine? They need to know if you have any of these conditions: -brain tumor -depression -drug abuse or addiction -head injury -if you frequently drink alcohol containing drinks -kidney disease or trouble passing urine -liver disease -lung disease, asthma, or breathing problems -seizures or epilepsy -suicidal thoughts, plans, or attempt; a previous suicide attempt by you or a family member -an unusual or allergic reaction to tramadol, codeine, other medicines, foods, dyes, or preservatives -pregnant or trying to get pregnant -breast-feeding How should I use this medicine? Take this medicine by mouth with a full glass of water. Follow the directions on the prescription label. You can take it with or without food. If it upsets your stomach, take it with food. Do not take your medicine more often than directed. A special MedGuide will be given to you by the pharmacist with each prescription and refill. Be sure to read this information carefully each time. Talk to your pediatrician regarding the use of this medicine in children. Special care may be needed. Overdosage: If you think you have taken too much of this medicine contact a poison control center or emergency room at once. NOTE: This medicine is only for you. Do not share this medicine with others. What if I miss a dose? If you miss a dose, take it as soon as you can. If it is almost time for your next dose, take only that dose. Do not take double or extra doses. What may interact with this medicine? Do not take this medication with any of the following medicines: -MAOIs like Carbex, Eldepryl,  Marplan, Nardil, and Parnate This medicine may also interact with the following medications: -alcohol -antihistamines for allergy, cough and cold -certain medicines for anxiety or sleep -certain medicines for depression like amitriptyline, fluoxetine, sertraline -certain medicines for migraine headache like almotriptan, eletriptan, frovatriptan, naratriptan, rizatriptan, sumatriptan, zolmitriptan -certain medicines for seizures like carbamazepine, oxcarbazepine, phenobarbital, primidone -certain medicines that treat or prevent blood clots like warfarin -digoxin -furazolidone -general anesthetics like halothane, isoflurane, methoxyflurane, propofol -linezolid -local anesthetics like lidocaine, pramoxine, tetracaine -medicines that relax muscles for surgery -other narcotic medicines for pain or cough -phenothiazines like chlorpromazine, mesoridazine, prochlorperazine, thioridazine -procarbazine This list may not describe all possible interactions. Give your health care provider a list of all the medicines, herbs, non-prescription drugs, or dietary supplements you use. Also tell them if you smoke, drink alcohol, or use illegal drugs. Some items may interact with your medicine. What should I watch for while using this medicine? Tell your doctor or health care professional if your pain does not go away, if it gets worse, or if you have new or a different type of pain. You may develop tolerance to the medicine. Tolerance means that you will need a higher dose of the medicine for pain relief. Tolerance is normal and is expected if you take this medicine for a long time. Do not suddenly stop taking your medicine because you may develop a severe reaction. Your body becomes used to the medicine. This does NOT mean you are addicted. Addiction is a behavior related to getting and using a drug for a non-medical reason. If you have pain, you  have a medical reason to take pain medicine. Your doctor will tell  you how much medicine to take. If your doctor wants you to stop the medicine, the dose will be slowly lowered over time to avoid any side effects. There are different types of narcotic medicines (opiates). If you take more than one type at the same time or if you are taking another medicine that also causes drowsiness, you may have more side effects. Give your health care provider a list of all medicines you use. Your doctor will tell you how much medicine to take. Do not take more medicine than directed. Call emergency for help if you have problems breathing or unusual sleepiness. You may get drowsy or dizzy. Do not drive, use machinery, or do anything that needs mental alertness until you know how this medicine affects you. Do not stand or sit up quickly, especially if you are an older patient. This reduces the risk of dizzy or fainting spells. Alcohol can increase or decrease the effects of this medicine. Avoid alcoholic drinks. You may have constipation. Try to have a bowel movement at least every 2 to 3 days. If you do not have a bowel movement for 3 days, call your doctor or health care professional. Your mouth may get dry. Chewing sugarless gum or sucking hard candy, and drinking plenty of water may help. Contact your doctor if the problem does not go away or is severe. What side effects may I notice from receiving this medicine? Side effects that you should report to your doctor or health care professional as soon as possible: -allergic reactions like skin rash, itching or hives, swelling of the face, lips, or tongue -breathing problems -confusion -seizures -signs and symptoms of low blood pressure like dizziness; feeling faint or lightheaded, falls; unusually weak or tired -trouble passing urine or change in the amount of urine Side effects that usually do not require medical attention (report to your doctor or health care professional if they continue or are bothersome): -constipation -dry  mouth -nausea, vomiting -tiredness This list may not describe all possible side effects. Call your doctor for medical advice about side effects. You may report side effects to FDA at 1-800-FDA-1088. Where should I keep my medicine? Keep out of the reach of children. This medicine may cause accidental overdose and death if it taken by other adults, children, or pets. Mix any unused medicine with a substance like cat litter or coffee grounds. Then throw the medicine away in a sealed container like a sealed bag or a coffee can with a lid. Do not use the medicine after the expiration date. Store at room temperature between 15 and 30 degrees C (59 and 86 degrees F). NOTE: This sheet is a summary. It may not cover all possible information. If you have questions about this medicine, talk to your doctor, pharmacist, or health care provider.  2018 Elsevier/Gold Standard (2014-11-04 09:00:04) Naproxen delayed-release tablets What is this medicine? NAPROXEN (na PROX en) is a non-steroidal anti-inflammatory drug (NSAID). It is used to reduce swelling and to treat pain. This medicine may be used for dental pain, headache, or painful monthly periods. It is also used for painful joint and muscular problems such as arthritis, tendinitis, bursitis, and gout. This medicine may be used for other purposes; ask your health care provider or pharmacist if you have questions. COMMON BRAND NAME(S): EC-Naprosyn What should I tell my health care provider before I take this medicine? They need to know if you have  any of these conditions: -asthma -cigarette smoker -drink more than 3 alcohol containing drinks a day -heart disease or circulation problems such as heart failure or leg edema (fluid retention) -high blood pressure -kidney disease -liver disease -stomach bleeding or ulcers -an unusual or allergic reaction to naproxen, aspirin, other NSAIDs, other medicines, foods, dyes, or preservatives -pregnant or trying  to get pregnant -breast-feeding How should I use this medicine? Take this medicine by mouth with a glass of water. Follow the directions on the prescription label. Do not cut, crush or chew this medicine. Take it with food if your stomach gets upset. Try to not lie down for at least 10 minutes after you take it. Take your medicine at regular intervals. Do not take your medicine more often than directed. Long-term, continuous use may increase the risk of heart attack or stroke. A special MedGuide will be given to you by the pharmacist with each prescription and refill. Be sure to read this information carefully each time. Talk to your pediatrician regarding the use of this medicine in children. Special care may be needed. Overdosage: If you think you have taken too much of this medicine contact a poison control center or emergency room at once. NOTE: This medicine is only for you. Do not share this medicine with others. What if I miss a dose? If you miss a dose, take it as soon as you can. If it is almost time for your next dose, take only that dose. Do not take double or extra doses. What may interact with this medicine? -alcohol -antacids -aspirin -cidofovir -diuretics -lithium -medicines for stomach, or intestine problems, like acid reflux or GERD -methotrexate -other drugs for inflammation like ketorolac or prednisone -pemetrexed -probenecid -sucralfate -warfarin This list may not describe all possible interactions. Give your health care provider a list of all the medicines, herbs, non-prescription drugs, or dietary supplements you use. Also tell them if you smoke, drink alcohol, or use illegal drugs. Some items may interact with your medicine. What should I watch for while using this medicine? Tell your doctor or health care professional if your pain does not get better. Talk to your doctor before taking another medicine for pain. Do not treat yourself. This medicine does not prevent  heart attack or stroke. In fact, this medicine may increase the chance of a heart attack or stroke. The chance may increase with longer use of this medicine and in people who have heart disease. If you take aspirin to prevent heart attack or stroke, talk with your doctor or health care professional. Do not take other medicines that contain aspirin, ibuprofen, or naproxen with this medicine. Side effects such as stomach upset, nausea, or ulcers may be more likely to occur. Many medicines available without a prescription should not be taken with this medicine. This medicine can cause ulcers and bleeding in the stomach and intestines at any time during treatment. Do not smoke cigarettes or drink alcohol. These increase irritation to your stomach and can make it more susceptible to damage from this medicine. Ulcers and bleeding can happen without warning symptoms and can cause death. You may get drowsy or dizzy. Do not drive, use machinery, or do anything that needs mental alertness until you know how this medicine affects you. Do not stand or sit up quickly, especially if you are an older patient. This reduces the risk of dizzy or fainting spells. This medicine can cause you to bleed more easily. Try to avoid damage to your teeth  and gums when you brush or floss your teeth. What side effects may I notice from receiving this medicine? Side effects that you should report to your doctor or health care professional as soon as possible: -black or bloody stools, blood in the urine or vomit -blurred vision -chest pain -difficulty breathing or wheezing -nausea or vomiting -skin rash, skin redness, blistering or peeling skin, hives, or itching -slurred speech or weakness on one side of the body -swelling of eyelids, throat, lips -unexplained weight gain or swelling -unusually weak or tired -yellowing of eyes or skin Side effects that usually do not require medical attention (report to your doctor or health  care professional if they continue or are bothersome): -constipation -headache -heartburn This list may not describe all possible side effects. Call your doctor for medical advice about side effects. You may report side effects to FDA at 1-800-FDA-1088. Where should I keep my medicine? Keep out of the reach of children. Store at room temperature between 15 and 30 degrees C (59 and 86 degrees F). Keep container tightly closed. Throw away any unused medicine after the expiration date. NOTE: This sheet is a summary. It may not cover all possible information. If you have questions about this medicine, talk to your doctor, pharmacist, or health care provider.  2018 Elsevier/Gold Standard (2009-02-11 20:20:30)

## 2017-09-03 LAB — URINE CULTURE

## 2017-09-07 ENCOUNTER — Telehealth: Payer: Self-pay

## 2017-09-07 NOTE — Telephone Encounter (Signed)
Pt called wanting to know if we can do circumcisions here?

## 2017-09-13 NOTE — Telephone Encounter (Signed)
Called patient, no answer- left message on voicemail stating we are trying to reach you to return your phone call, please call us back if you still need assistance. 

## 2017-10-04 ENCOUNTER — Ambulatory Visit (INDEPENDENT_AMBULATORY_CARE_PROVIDER_SITE_OTHER): Payer: Medicaid Other | Admitting: Family Medicine

## 2017-10-04 ENCOUNTER — Encounter: Payer: Self-pay | Admitting: Family Medicine

## 2017-10-04 VITALS — BP 128/82 | HR 90 | Temp 99.1°F | Ht 72.0 in | Wt 283.0 lb

## 2017-10-04 DIAGNOSIS — L309 Dermatitis, unspecified: Secondary | ICD-10-CM

## 2017-10-04 DIAGNOSIS — S39012A Strain of muscle, fascia and tendon of lower back, initial encounter: Secondary | ICD-10-CM

## 2017-10-04 DIAGNOSIS — I10 Essential (primary) hypertension: Secondary | ICD-10-CM

## 2017-10-04 DIAGNOSIS — Z09 Encounter for follow-up examination after completed treatment for conditions other than malignant neoplasm: Secondary | ICD-10-CM

## 2017-10-04 LAB — POCT URINALYSIS DIP (MANUAL ENTRY)
Bilirubin, UA: NEGATIVE
Blood, UA: NEGATIVE
Glucose, UA: NEGATIVE mg/dL
Ketones, POC UA: NEGATIVE mg/dL
Leukocytes, UA: NEGATIVE
Nitrite, UA: NEGATIVE
Protein Ur, POC: NEGATIVE mg/dL
Spec Grav, UA: 1.02 (ref 1.010–1.025)
Urobilinogen, UA: 0.2 E.U./dL
pH, UA: 6 (ref 5.0–8.0)

## 2017-10-04 MED ORDER — TRIAMCINOLONE ACETONIDE 0.5 % EX OINT: 1.0000 "application " | TOPICAL_OINTMENT | Freq: Two times a day (BID) | CUTANEOUS | 2 refills | Status: DC

## 2017-10-04 MED ORDER — TRAMADOL HCL 50 MG PO TABS
50.0000 mg | ORAL_TABLET | Freq: Three times a day (TID) | ORAL | 0 refills | Status: DC | PRN
Start: 2017-10-04 — End: 2017-10-21

## 2017-10-04 NOTE — Progress Notes (Signed)
Follow Up  Subjective:    Patient ID: Alexandria LeaverSieda Loiselle, female    DOB: [birth year], 27 y.o.   MRN: [id number]   Chief Complaint  Patient presents with  . Follow-up    HTN   HPI  Ms. Alexandria Beltran has a past medical history of Scoliosis, Gestational Diabetes, Asthma, and Anemia. She is here today for follow up.    Current Status: Since her last office visit, she is doing well with no complaints  She denies fevers, chills, fatigue, recent infections, weight loss, and night sweats.   She has not had any headaches, visual changes, dizziness, and falls.   No chest pain, heart palpitations, cough and shortness of breath reported.   No reports of GI problems such as nausea, vomiting, diarrhea, and constipation. She has no reports of blood in stools, dysuria and hematuria.   No depression or anxiety reported.  Past Medical History:  Diagnosis Date  . Anemia   . Asthma    Inhaler used 07/22/16  . Gestational diabetes   . Scoliosis     Family History  Problem Relation Age of Onset  . Cancer Mother   . Diabetes Mother   . Alcohol abuse Mother   . Alcohol abuse Maternal Aunt   . Cancer Maternal Uncle   . Alcohol abuse Maternal Uncle     Social History   Socioeconomic History  . Marital status: Single    Spouse name: Not on file  . Number of children: Not on file  . Years of education: Not on file  . Highest education level: Not on file  Occupational History  . Not on file  Social Needs  . Financial resource strain: Not on file  . Food insecurity:    Worry: Not on file    Inability: Not on file  . Transportation needs:    Medical: Not on file    Non-medical: Not on file  Tobacco Use  . Smoking status: Former Smoker    Packs/day: 0.25    Years: 6.00    Pack years: 1.50    Types: Cigarettes    Last attempt to quit: 2018    Years since quitting: 1.6  . Smokeless tobacco: Never Used  Substance and Sexual Activity  . Alcohol use: No    Frequency: Never    Comment:  occasionally (not with pregnancy)  . Drug use: No  . Sexual activity: Yes    Birth control/protection: None    Comment: undecided between two  Lifestyle  . Physical activity:    Days per week: Not on file    Minutes per session: Not on file  . Stress: Not on file  Relationships  . Social connections:    Talks on phone: Not on file    Gets together: Not on file    Attends religious service: Not on file    Active member of club or organization: Not on file    Attends meetings of clubs or organizations: Not on file    Relationship status: Not on file  . Intimate partner violence:    Fear of current or ex partner: Not on file    Emotionally abused: Not on file    Physically abused: Not on file    Forced sexual activity: Not on file  Other Topics Concern  . Not on file  Social History Narrative  . Not on file    Past Surgical History:  Procedure Laterality Date  . NO PAST SURGERIES  Immunization History  Administered Date(s) Administered  . PPD Test 08/10/2017  . Tdap 05/07/2017    Current Meds  Medication Sig  . ACCU-CHEK FASTCLIX LANCETS MISC 1 each by Does not apply route 4 (four) times daily.  Marland Kitchen albuterol (PROVENTIL HFA;VENTOLIN HFA) 108 (90 Base) MCG/ACT inhaler Inhale 2 puffs into the lungs every 6 (six) hours as needed for wheezing or shortness of breath.  Marland Kitchen glucose blood (ACCU-CHEK GUIDE) test strip 1 each by Other route 4 (four) times daily. Use as instructed  . naproxen (NAPROSYN) 500 MG tablet Take 1 tablet (500 mg total) by mouth 2 (two) times daily with a meal.  . triamcinolone ointment (KENALOG) 0.5 % Apply 1 application topically 2 (two) times daily.  . [DISCONTINUED] triamcinolone ointment (KENALOG) 0.5 % Apply 1 application topically 2 (two) times daily.   Current Facility-Administered Medications for the 10/04/17 encounter (Office Visit) with Kallie Locks, FNP  Medication  . polyethylene glycol (MIRALAX / GLYCOLAX) packet 17 g    Allergies   Allergen Reactions  . Bee Pollen   . Pollen Extract     BP 128/82 (BP Location: Left Arm, Patient Position: Sitting, Cuff Size: Large)   Pulse 90   Temp 99.1 F (37.3 C) (Oral)   Ht 6' (1.829 m)   Wt 283 lb (128.4 kg)   SpO2 98%   BMI 38.38 kg/m    Review of Systems  Constitutional: Negative.   HENT: Negative.   Eyes: Negative.   Respiratory: Positive for shortness of breath.   Cardiovascular: Negative.   Gastrointestinal: Positive for abdominal pain (cramps r/t IUD).  Endocrine: Negative.   Musculoskeletal: Positive for back pain (mild--r/t history of Scoliosis).  Skin: Positive for rash (eczema on upper thighs.).  Allergic/Immunologic: Negative.   Neurological: Negative.   Hematological: Negative.   Psychiatric/Behavioral: Negative.    Objective:   Physical Exam  Constitutional: She is oriented to person, place, and time. She appears well-developed and well-nourished.  HENT:  Head: Normocephalic and atraumatic.  Right Ear: External ear normal.  Left Ear: External ear normal.  Nose: Nose normal.  Mouth/Throat: Oropharynx is clear and moist.  Eyes: Pupils are equal, round, and reactive to light. Conjunctivae and EOM are normal.  Neck: Normal range of motion. Neck supple.  Cardiovascular: Normal rate, regular rhythm, normal heart sounds and intact distal pulses.  Pulmonary/Chest: Effort normal and breath sounds normal.  Abdominal: Soft. Bowel sounds are normal.  Musculoskeletal: Normal range of motion.  Neurological: She is alert and oriented to person, place, and time.  Skin: Skin is warm and dry.  Psychiatric: She has a normal mood and affect. Her behavior is normal. Judgment and thought content normal.  Nursing note and vitals reviewed.  Assessment & Plan:   1. Hypertension, unspecified type Blood pressure is stable today at 128/82. She is currently not taking any antihypertensive medications at this time. We will continue to monitor.  - POCT urinalysis  dipstick  2. Back strain, initial encounter Refill.  - traMADol (ULTRAM) 50 MG tablet; Take 1 tablet (50 mg total) by mouth every 8 (eight) hours as needed.  Dispense: 30 tablet; Refill: 0  3. Eczema, unspecified type Stable. Upper thigh area. We will refill Triamcinolone Ointment today.  - triamcinolone ointment (KENALOG) 0.5 %; Apply 1 application topically 2 (two) times daily.  Dispense: 30 g; Refill: 2  4. Follow up She will follow up in 3 months.   Meds ordered this encounter  Medications  . traMADol (ULTRAM) 50  MG tablet    Sig: Take 1 tablet (50 mg total) by mouth every 8 (eight) hours as needed.    Dispense:  30 tablet    Refill:  0    Order Specific Question:   Supervising Provider    Answer:   Quentin AngstJEGEDE, OLUGBEMIGA E [id number]]  . triamcinolone ointment (KENALOG) 0.5 %    Sig: Apply 1 application topically 2 (two) times daily.    Dispense:  30 g    Refill:  2    Raliegh IpNatalie Idamae Coccia,  MSN, FNP-C [facility] [address]  [place], [place] [id number] [phone]

## 2017-10-21 ENCOUNTER — Other Ambulatory Visit: Payer: Self-pay | Admitting: Family Medicine

## 2017-10-21 ENCOUNTER — Telehealth: Payer: Self-pay

## 2017-10-21 DIAGNOSIS — S39012A Strain of muscle, fascia and tendon of lower back, initial encounter: Secondary | ICD-10-CM

## 2017-10-21 MED ORDER — TRAMADOL HCL 50 MG PO TABS: 50.0000 mg | ORAL_TABLET | Freq: Three times a day (TID) | ORAL | 0 refills | Status: DC | PRN

## 2017-10-21 NOTE — Progress Notes (Signed)
Rx for Tramadol sent to pharmacy today.  

## 2017-10-26 ENCOUNTER — Emergency Department (HOSPITAL_COMMUNITY)
Admission: EM | Admit: 2017-10-26 | Discharge: 2017-10-26 | Disposition: A | Discharge status: Home / Self Care | Payer: Medicaid Other | Attending: Emergency Medicine | Admitting: Emergency Medicine

## 2017-10-26 ENCOUNTER — Emergency Department (HOSPITAL_COMMUNITY): Payer: Medicaid Other

## 2017-10-26 ENCOUNTER — Encounter (HOSPITAL_COMMUNITY): Payer: Self-pay | Admitting: *Deleted

## 2017-10-26 ENCOUNTER — Telehealth: Payer: Self-pay

## 2017-10-26 DIAGNOSIS — Z79899 Other long term (current) drug therapy: Secondary | ICD-10-CM | POA: Insufficient documentation

## 2017-10-26 DIAGNOSIS — M79605 Pain in left leg: Secondary | ICD-10-CM | POA: Insufficient documentation

## 2017-10-26 DIAGNOSIS — J45909 Unspecified asthma, uncomplicated: Secondary | ICD-10-CM | POA: Diagnosis not present

## 2017-10-26 DIAGNOSIS — S39012A Strain of muscle, fascia and tendon of lower back, initial encounter: Secondary | ICD-10-CM

## 2017-10-26 DIAGNOSIS — Z87891 Personal history of nicotine dependence: Secondary | ICD-10-CM | POA: Insufficient documentation

## 2017-10-26 LAB — POC URINE PREG, ED: Preg Test, Ur: NEGATIVE

## 2017-10-26 MED ORDER — ACETAMINOPHEN 325 MG PO TABS
650.0000 mg | ORAL_TABLET | Freq: Once | ORAL | Status: AC
  Administered 2017-10-26: 650 mg via ORAL
  Filled 2017-10-26: qty 2

## 2017-10-26 NOTE — ED Notes (Signed)
Pt transported to radiology.

## 2017-10-26 NOTE — Telephone Encounter (Signed)
Medication was approved for 30 days and a new authorization was submitted with notes to approve for 90 days

## 2017-10-26 NOTE — ED Notes (Signed)
ED Provider at bedside. 

## 2017-10-26 NOTE — Discharge Instructions (Signed)
I have prescribed medication for your pain please take as directed. You may apply ice/heat to the area.

## 2017-10-26 NOTE — Telephone Encounter (Signed)
PA has been submitted to [facility] and waiting for approval
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
## 2017-10-26 NOTE — Telephone Encounter (Signed)
Spoke with pharmacy and patient needs a script sent in. Patient last pick up script on 10/04/2017 and they don't have another one on file.
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
## 2017-10-27 ENCOUNTER — Other Ambulatory Visit: Payer: Self-pay | Admitting: Family Medicine

## 2017-10-27 DIAGNOSIS — S39012A Strain of muscle, fascia and tendon of lower back, initial encounter: Secondary | ICD-10-CM

## 2017-10-27 MED ORDER — TRAMADOL HCL 50 MG PO TABS
50.0000 mg | ORAL_TABLET | Freq: Three times a day (TID) | ORAL | 0 refills | Status: DC | PRN
Start: 2017-10-27 — End: 2017-12-24

## 2017-10-27 NOTE — Progress Notes (Signed)
Rx for Ultram to pharmacy today.

## 2017-10-29 ENCOUNTER — Telehealth: Payer: Self-pay | Admitting: Family Medicine

## 2017-10-29 NOTE — Telephone Encounter (Signed)
Spoke with patient concerning continual refill requests for Tramadol. Last refill request granted on 10/27/2017, afterwards, we will send Rx for NSAIDS if needed in the future and possibly refer patient to Physical Therapy for alternative ways of controlling her back pain. Patient verbalized understanding.   Raliegh Ip,  MSN, FNP-C Patient Care [facility] [address] [place], [place] [address] [phone]

## 2017-12-03 ENCOUNTER — Other Ambulatory Visit: Payer: Self-pay | Admitting: Family Medicine

## 2017-12-03 DIAGNOSIS — S39012A Strain of muscle, fascia and tendon of lower back, initial encounter: Secondary | ICD-10-CM

## 2017-12-24 ENCOUNTER — Ambulatory Visit (INDEPENDENT_AMBULATORY_CARE_PROVIDER_SITE_OTHER): Payer: Medicaid Other | Admitting: Family Medicine

## 2017-12-24 ENCOUNTER — Encounter: Payer: Self-pay | Admitting: Family Medicine

## 2017-12-24 VITALS — BP 110/74 | HR 80 | Temp 98.1°F | Ht 72.0 in | Wt 277.6 lb

## 2017-12-24 DIAGNOSIS — Z0000 Encounter for general adult medical examination without abnormal findings: Secondary | ICD-10-CM | POA: Diagnosis not present

## 2017-12-24 DIAGNOSIS — Z09 Encounter for follow-up examination after completed treatment for conditions other than malignant neoplasm: Secondary | ICD-10-CM

## 2017-12-24 DIAGNOSIS — M549 Dorsalgia, unspecified: Secondary | ICD-10-CM

## 2017-12-24 DIAGNOSIS — S39012A Strain of muscle, fascia and tendon of lower back, initial encounter: Secondary | ICD-10-CM | POA: Diagnosis not present

## 2017-12-24 DIAGNOSIS — I10 Essential (primary) hypertension: Secondary | ICD-10-CM

## 2017-12-24 DIAGNOSIS — R0602 Shortness of breath: Secondary | ICD-10-CM

## 2017-12-24 LAB — POCT URINALYSIS DIP (MANUAL ENTRY)
Bilirubin, UA: NEGATIVE
Blood, UA: NEGATIVE
Glucose, UA: NEGATIVE mg/dL
Ketones, POC UA: NEGATIVE mg/dL
Leukocytes, UA: NEGATIVE
Nitrite, UA: NEGATIVE
Protein Ur, POC: 30 mg/dL — AB
Spec Grav, UA: >=1.03 — AB (ref 1.010–1.025)
Urobilinogen, UA: 1 E.U./dL
pH, UA: 6 (ref 5.0–8.0)

## 2017-12-24 MED ORDER — TRAMADOL HCL 50 MG PO TABS: 50.0000 mg | ORAL_TABLET | Freq: Three times a day (TID) | ORAL | 0 refills | Status: DC | PRN

## 2017-12-24 MED ORDER — ALBUTEROL SULFATE (2.5 MG/3ML) 0.083% IN NEBU: 2.5000 mg | INHALATION_SOLUTION | Freq: Four times a day (QID) | RESPIRATORY_TRACT | 12 refills | Status: DC | PRN

## 2017-12-24 NOTE — Progress Notes (Signed)
Follow Up  Subjective:    Patient ID: Alexandria Beltran, female    DOB: [birth year], 27 y.o.   MRN: [id number]   Chief Complaint  Patient presents with  . Back Pain   HPI  Alexandria Beltran is a 27 year old female with a past medical history of Scoliosis, Gestational Diabetes, Chronic Back Pain, Asthma, and Anemia. She is here today for follow up.   Current Status: Since her last office visit, she continues to have back pain, which she is taking OTC pain medication. She has been having increasing shortness of breath. She repo  She denies fevers, chills, fatigue, recent infections, weight loss, and night sweats. She has not had any headaches, visual changes, dizziness, and falls. No chest pain, heart palpitations, and cough reported. No reports of GI problems such as nausea, vomiting, diarrhea, and constipation. She has no reports of blood in stools, dysuria and hematuria. No depression or anxiety, and denies suicidal ideations, homicidal ideations, or auditory hallucinations. She denies pain today.       Review of Systems  Constitutional: Negative.   HENT: Negative.   Respiratory: Negative.   Cardiovascular: Negative.   Gastrointestinal: Negative.   Genitourinary: Negative.   Musculoskeletal: Positive for back pain (Chronic).  Skin: Negative.   Neurological: Negative.   Psychiatric/Behavioral: Negative.    Objective:   Physical Exam  Constitutional: She is oriented to person, place, and time. She appears well-developed and well-nourished.  HENT:  Head: Normocephalic and atraumatic.  Cardiovascular: Normal rate, regular rhythm, normal heart sounds and intact distal pulses.  Pulmonary/Chest: Effort normal and breath sounds normal.  Abdominal: Soft. Bowel sounds are normal.  Neurological: She is alert and oriented to person, place, and time.  Skin: Skin is warm and dry.  Psychiatric: She has a normal mood and affect. Her behavior is normal. Judgment and thought content normal.  Nursing  note and vitals reviewed.  Assessment & Plan:   1. Hypertension, unspecified type Blood pressure continues to be stable at 110/74 today. She will continue to decrease high sodium intake, excessive alcohol intake, increase potassium intake, smoking cessation, and increase physical activity of at least 30 minutes of cardio activity daily. She will continue to follow Heart Healthy or DASH diet.  2. Back pain, unspecified back location, unspecified back pain laterality, unspecified chronicity - POCT urinalysis dipstick - Ambulatory referral to Physical Therapy - traMADol (ULTRAM) 50 MG tablet; Take 1 tablet (50 mg total) by mouth every 8 (eight) hours as needed.  Dispense: 30 tablet; Refill: 0  3. Healthcare maintenance - Microalbumin, urine  4. Back strain, initial encounter - traMADol (ULTRAM) 50 MG tablet; Take 1 tablet (50 mg total) by mouth every 8 (eight) hours as needed.  Dispense: 30 tablet; Refill: 0  5. Shortness of breath - albuterol (PROVENTIL) (2.5 MG/3ML) 0.083% nebulizer solution; Take 3 mLs (2.5 mg total) by nebulization every 6 (six) hours as needed for wheezing or shortness of breath.  Dispense: 150 mL; Refill: 12  6. Follow up She will follow up in 5 months.    Meds ordered this encounter  Medications  . traMADol (ULTRAM) 50 MG tablet    Sig: Take 1 tablet (50 mg total) by mouth every 8 (eight) hours as needed.    Dispense:  30 tablet    Refill:  0  . albuterol (PROVENTIL) (2.5 MG/3ML) 0.083% nebulizer solution    Sig: Take 3 mLs (2.5 mg total) by nebulization every 6 (six) hours as needed for wheezing or shortness  of breath.    Dispense:  150 mL    Refill:  12     Referral Orders     Ambulatory referral to Physical Therapy   Raliegh Ip,  MSN, FNP-C [facility] [address] [place], [place] [address] [phone]

## 2017-12-25 LAB — MICROALBUMIN, URINE: Microalbumin, Urine: 25.6 ug/mL

## 2018-01-04 ENCOUNTER — Ambulatory Visit: Payer: Self-pay | Admitting: Family Medicine

## 2018-01-11 ENCOUNTER — Telehealth: Payer: Self-pay

## 2018-01-11 ENCOUNTER — Other Ambulatory Visit: Payer: Self-pay

## 2018-01-11 ENCOUNTER — Ambulatory Visit: Payer: Medicaid Other | Attending: Family Medicine

## 2018-01-11 ENCOUNTER — Other Ambulatory Visit: Payer: Self-pay | Admitting: Family Medicine

## 2018-01-11 DIAGNOSIS — M545 Low back pain: Secondary | ICD-10-CM | POA: Diagnosis not present

## 2018-01-11 DIAGNOSIS — M6281 Muscle weakness (generalized): Secondary | ICD-10-CM | POA: Insufficient documentation

## 2018-01-11 DIAGNOSIS — R293 Abnormal posture: Secondary | ICD-10-CM | POA: Diagnosis present

## 2018-01-11 DIAGNOSIS — M62830 Muscle spasm of back: Secondary | ICD-10-CM | POA: Diagnosis present

## 2018-01-11 DIAGNOSIS — G8929 Other chronic pain: Secondary | ICD-10-CM | POA: Diagnosis present

## 2018-01-11 DIAGNOSIS — M549 Dorsalgia, unspecified: Secondary | ICD-10-CM

## 2018-01-11 DIAGNOSIS — S39012A Strain of muscle, fascia and tendon of lower back, initial encounter: Secondary | ICD-10-CM

## 2018-01-11 MED ORDER — TRAMADOL HCL 50 MG PO TABS: 50.0000 mg | ORAL_TABLET | Freq: Three times a day (TID) | ORAL | 0 refills | Status: DC | PRN

## 2018-01-11 NOTE — Therapy (Signed)
[facility] [address] [place], [place], [address] Phone: [phone]   Fax:  [phone]  Physical Therapy Evaluation  Patient Details  Name: Alexandria [facility] MRN: [id number] Date of Birth: 10-11-1990 Referring Provider (PT): Alexandria Ip, FNP   Encounter Date: 01/11/2018  PT End of Session - 01/11/18 0914    Visit Number  1    Number of Visits  12    Date for PT Re-Evaluation  03/11/18    Authorization Type  MCD    PT Start Time  0912    PT Stop Time  1000    PT Time Calculation (min)  48 min    Activity Tolerance  Patient tolerated treatment well    Behavior During Therapy  [facility] for tasks assessed/performed       Past Medical History:  Diagnosis Date  . Anemia   . Asthma    Inhaler used 07/22/16  . Chronic back pain   . Gestational diabetes   . Scoliosis     Past Surgical History:  Procedure Laterality Date  . NO PAST SURGERIES      There were no vitals filed for this visit.   Subjective Assessment - 01/11/18 0925    Subjective  She reports chronic back pain.  NP wants to not prescribe controlled meds so referred to PT.  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     Pertinent History  scoliosis.   epidural  in 2018 and 2019 For birth /delivery.     Limitations  Sitting;Lifting;Standing;House hold activities   Lying, bending,    How long can you sit comfortably?  30 min    How long can you stand comfortably?  Not sure    How long can you walk comfortably?  10 min    Diagnostic tests  none    Patient Stated Goals  She wants to be ale to lift children with less pain, sit up straight,  decr pain.    Currently in Pain?  Yes    Pain Score  6     Pain Location  Back    Pain Orientation  Lower;Right   central   Pain Descriptors / Indicators  Sharp;Shooting   pulling   Pain Type  Chronic pain    Pain Onset  More than a month ago    Pain Frequency  Constant     Aggravating Factors   bend, lift , sit , lye,     Pain Relieving Factors  medication, rest. 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   Hand Dominance  Right    Next MD Visit  Not sure when    Prior Therapy  No      Precautions   Precautions  None      Restrictions   Weight Bearing Restrictions  No      Balance Screen   Has the patient fallen in the past 6 months  No      Prior Function   Level of Independence  Independent    Vocation  Full time employment    Vocation Requirements  She has to stand and  lift at work       Cognition   Overall Cognitive Status  Within Functional Limits for tasks assessed      Posture/Postural Control   Posture Comments  scoliosis RT ilia higher , incr thoracic kyphosis and incr lumbar lordosis      ROM / Strength   AROM / PROM / Strength  AROM;Strength;PROM      AROM   AROM Assessment Site  Lumbar    Lumbar Flexion  60    Lumbar Extension  15    Lumbar - Right Side Bend  10    Lumbar - Left Side Bend  10      PROM   PROM Assessment Site  Lumbar      Strength   Overall Strength Comments  Quads /hams / ankle DF 5/5/      Flexibility   Soft Tissue Assessment /Muscle Length  yes    Hamstrings  45 degrees bilaterally                 Objective measurements completed on examination: See above findings.              PT Education - 01/11/18 1016    Education Details  POC HEP, posture in sitting    Person(s) Educated  Patient    Methods  Explanation;Demonstration;Verbal cues;Handout;Tactile cues    Comprehension  Verbalized understanding;Returned demonstration       PT Short Term Goals - 01/11/18 0912      PT SHORT TERM GOAL #1   Title  She will be independent with initial HEp    Baseline  no program    Time  3     Period  Weeks    Status  New      PT SHORT TERM GOAL #2   Title  She will demo understanding of good sitting posture    Baseline  slump sitting    Time  3    Period  Weeks    Status  New      PT SHORT TERM GOAL #3   Title  Shewill report pain decreased 10-20%    Baseline  Pain level at eval     /10    Time  3    Period  Weeks    Status  New        PT Long Term Goals - 01/11/18 0913      PT LONG TERM GOAL #1   Title  she will be indpenendent with all HEp issued    Baseline  independent with intial HEP    Time  6    Period  Weeks    Status  New      PT LONG TERM GOAL #2   Title  She wll report pain decreased 50% or more generally    Baseline  10-20% decr pain    Time  6    Period  Weeks    Status  New      PT LONG TERM GOAL #3   Title  She will report lifting children pain decr 30-40% or mroe    Baseline  mod to severe pain with lifitng children.     Time  6    Period  Weeks    Status  New      PT LONG TERM GOAL #4   Title  She will report with support able to sit > 45 min without incr pain.    Baseline  30 min  max    Time  6    Period  Days      PT LONG TERM GOAL #5   Title  She will report able to sleep in bed     Baseline  sleeps sitting up on incline in futon    Time  6    Period  Weeks    Status  New             Plan - 01/11/18 0915    Clinical Impression Statement  Alexandria Beltran presents with chronic lower back pain RT>LT with weakness of core , scoliosis limiting ability to do job and perform hjome and childcare tasks without pain.   With incr strength and stretching program she shoul be able to decr pain.     History and Personal Factors relevant to plan of care:  scolisis, heavy lifting at work . 2 young children , obesity.     Clinical Presentation  Unstable    Clinical Presentation due to:  chronic LBP    Clinical Decision Making  Moderate    Rehab Potential  Good    PT Frequency  --   3 visits    PT Duration  --   over 2 weeks then  2x/week for 4 weeks   PT Treatment/Interventions  Patient/family education;Therapeutic exercise;Therapeutic activities;Ultrasound;Moist Heat;Electrical Stimulation;Manual techniques;Dry needling    PT Next Visit Plan  Progress HEP . Manuals and modalities    PT Home Exercise Plan  Kneee to chest, abdominal set, glut set    Consulted and Agree with Plan of Care  Patient       Patient will benefit from skilled therapeutic intervention in order to improve the following deficits and impairments:  Pain, Postural dysfunction, Increased muscle spasms, Decreased activity tolerance, Decreased range of motion, Decreased strength  Visit Diagnosis: Chronic low back pain, unspecified back pain laterality, unspecified whether sciatica present  Abnormal posture  Muscle spasm of back  Muscle weakness (generalized)     Problem List Patient Active Problem List   Diagnosis Date Noted  . Anemia 06/26/2017  . Gestational diabetes 06/25/2017  . NSVD (normal spontaneous vaginal delivery) 06/25/2017  . Supervision of high risk pregnancy, antepartum 04/05/2017  . Asthma affecting pregnancy in third trimester 04/05/2017  . GDM, class A2 04/05/2017    Caprice RedChasse, Jackson Fetters M  PT 01/11/2018, 10:17 AM  [facility] [address] [place], [place], [address] Phone: [phone]   Fax:  [phone]  Name: Alexandria Beltran MRN: [id number] Date of Birth: 1990-10-21

## 2018-01-11 NOTE — Progress Notes (Signed)
Rx for Tramadol sent to pharmacy today.  

## 2018-01-11 NOTE — Patient Instructions (Signed)
Gluteal Sets    Squeeze pelvic floor and hold. Tighten bottom. Hold for _5-10__ seconds. Relax for __5_ seconds. Repeat __10_ times. Do __2-3_ times a day.   Copyright  VHI. All rights reserved.  PELVIC TILT: Posterior    Tighten abdominals, flatten low back. 10___ reps per set, _2-3__ sets per day, _7__ days per week   Copyright  VHI. All rights reserved.  Knee to Chest    Lying supine, bend involved knee to chest ___ times. Repeat with other leg. Do ___ times per day.  Copyright  VHI. All rights reserved.

## 2018-01-12 NOTE — Telephone Encounter (Signed)
Tried to reach patient vm was full

## 2018-01-25 ENCOUNTER — Ambulatory Visit: Payer: Medicaid Other | Attending: Family Medicine | Admitting: Physical Therapy

## 2018-01-25 ENCOUNTER — Telehealth: Payer: Self-pay | Admitting: Physical Therapy

## 2018-01-25 NOTE — Telephone Encounter (Signed)
Called patient about missed visit this morning.  She was unaware of appointment.  Patient will not be able to attend next appointment on the 10 th .  She plans to attend the appointment at 9:30 on the 17th.  Phone number given for her to call if she is unable to attend. Due to our policy for attendance she was asked to call if she was unable. Liz BeachKaren Harris PTA

## 2018-01-28 ENCOUNTER — Other Ambulatory Visit: Payer: Self-pay | Admitting: Family Medicine

## 2018-01-28 ENCOUNTER — Telehealth: Payer: Self-pay

## 2018-01-28 DIAGNOSIS — M549 Dorsalgia, unspecified: Secondary | ICD-10-CM

## 2018-01-28 DIAGNOSIS — S39012A Strain of muscle, fascia and tendon of lower back, initial encounter: Secondary | ICD-10-CM

## 2018-01-31 ENCOUNTER — Other Ambulatory Visit: Payer: Self-pay | Admitting: Family Medicine

## 2018-01-31 DIAGNOSIS — S39012A Strain of muscle, fascia and tendon of lower back, initial encounter: Secondary | ICD-10-CM

## 2018-01-31 DIAGNOSIS — M549 Dorsalgia, unspecified: Secondary | ICD-10-CM

## 2018-01-31 MED ORDER — TRAMADOL HCL 50 MG PO TABS: 50.0000 mg | ORAL_TABLET | Freq: Three times a day (TID) | ORAL | 0 refills | Status: DC | PRN

## 2018-01-31 NOTE — Progress Notes (Signed)
Rx for Ultram sent to pharmacy today.  

## 2018-02-01 ENCOUNTER — Ambulatory Visit: Payer: Medicaid Other

## 2018-02-08 ENCOUNTER — Telehealth: Payer: Self-pay | Admitting: Physical Therapy

## 2018-02-08 ENCOUNTER — Ambulatory Visit: Payer: Medicaid Other

## 2018-02-08 NOTE — Telephone Encounter (Signed)
Message left on cell phone voice mail about missed appointment today and need to call us if she wants re-eval to continue PT.   Clinic number left on message

## 2018-02-27 ENCOUNTER — Emergency Department (HOSPITAL_COMMUNITY): Payer: Medicaid Other

## 2018-02-27 ENCOUNTER — Emergency Department (HOSPITAL_COMMUNITY)
Admission: EM | Admit: 2018-02-27 | Discharge: 2018-02-27 | Disposition: A | Discharge status: Home / Self Care | Payer: Medicaid Other | Attending: Emergency Medicine | Admitting: Emergency Medicine

## 2018-02-27 ENCOUNTER — Other Ambulatory Visit: Payer: Self-pay

## 2018-02-27 DIAGNOSIS — R109 Unspecified abdominal pain: Secondary | ICD-10-CM | POA: Diagnosis not present

## 2018-02-27 DIAGNOSIS — J09X2 Influenza due to identified novel influenza A virus with other respiratory manifestations: Secondary | ICD-10-CM | POA: Diagnosis not present

## 2018-02-27 DIAGNOSIS — Z79899 Other long term (current) drug therapy: Secondary | ICD-10-CM | POA: Insufficient documentation

## 2018-02-27 DIAGNOSIS — J111 Influenza due to unidentified influenza virus with other respiratory manifestations: Secondary | ICD-10-CM

## 2018-02-27 DIAGNOSIS — F17210 Nicotine dependence, cigarettes, uncomplicated: Secondary | ICD-10-CM | POA: Diagnosis not present

## 2018-02-27 DIAGNOSIS — R112 Nausea with vomiting, unspecified: Secondary | ICD-10-CM | POA: Diagnosis not present

## 2018-02-27 DIAGNOSIS — R509 Fever, unspecified: Secondary | ICD-10-CM | POA: Diagnosis present

## 2018-02-27 DIAGNOSIS — R69 Illness, unspecified: Secondary | ICD-10-CM

## 2018-02-27 LAB — URINALYSIS, ROUTINE W REFLEX MICROSCOPIC
Bilirubin Urine: NEGATIVE
Glucose, UA: NEGATIVE mg/dL
Hgb urine dipstick: NEGATIVE
Ketones, ur: NEGATIVE mg/dL
Leukocytes, UA: NEGATIVE
Nitrite: NEGATIVE
Protein, ur: NEGATIVE mg/dL
Specific Gravity, Urine: 1.009 (ref 1.005–1.030)
pH: 5 (ref 5.0–8.0)

## 2018-02-27 LAB — CBC WITH DIFFERENTIAL/PLATELET
Abs Immature Granulocytes: 0.03 K/uL (ref 0.00–0.07)
Basophils Absolute: 0 K/uL (ref 0.0–0.1)
Basophils Relative: 0 %
Eosinophils Absolute: 0 K/uL (ref 0.0–0.5)
Eosinophils Relative: 1 %
HCT: 42 % (ref 36.0–46.0)
Hemoglobin: 13.1 g/dL (ref 12.0–15.0)
Immature Granulocytes: 0 %
Lymphocytes Relative: 12 %
Lymphs Abs: 0.9 K/uL (ref 0.7–4.0)
MCH: 28.1 pg (ref 26.0–34.0)
MCHC: 31.2 g/dL (ref 30.0–36.0)
MCV: 90.1 fL (ref 80.0–100.0)
Monocytes Absolute: 0.5 K/uL (ref 0.1–1.0)
Monocytes Relative: 6 %
Neutro Abs: 6.2 K/uL (ref 1.7–7.7)
Neutrophils Relative %: 81 %
Platelets: 284 K/uL (ref 150–400)
RBC: 4.66 MIL/uL (ref 3.87–5.11)
RDW: 13 % (ref 11.5–15.5)
WBC: 7.6 K/uL (ref 4.0–10.5)
nRBC: 0 % (ref 0.0–0.2)

## 2018-02-27 LAB — INFLUENZA PANEL BY PCR (TYPE A & B)
Influenza A By PCR: NEGATIVE
Influenza B By PCR: NEGATIVE

## 2018-02-27 LAB — COMPREHENSIVE METABOLIC PANEL WITH GFR
ALT: 23 U/L (ref 0–44)
AST: 21 U/L (ref 15–41)
Albumin: 3.6 g/dL (ref 3.5–5.0)
Alkaline Phosphatase: 51 U/L (ref 38–126)
Anion gap: 7 (ref 5–15)
BUN: 8 mg/dL (ref 6–20)
CO2: 24 mmol/L (ref 22–32)
Calcium: 8.1 mg/dL — ABNORMAL LOW (ref 8.9–10.3)
Chloride: 101 mmol/L (ref 98–111)
Creatinine, Ser: 0.7 mg/dL (ref 0.44–1.00)
GFR calc Af Amer: >60 mL/min
GFR calc non Af Amer: >60 mL/min
Glucose, Bld: 100 mg/dL — ABNORMAL HIGH (ref 70–99)
Potassium: 3.3 mmol/L — ABNORMAL LOW (ref 3.5–5.1)
Sodium: 132 mmol/L — ABNORMAL LOW (ref 135–145)
Total Bilirubin: 0.5 mg/dL (ref 0.3–1.2)
Total Protein: 8 g/dL (ref 6.5–8.1)

## 2018-02-27 LAB — I-STAT BETA HCG BLOOD, ED (MC, WL, AP ONLY): I-stat hCG, quantitative: <5 m[IU]/mL

## 2018-02-27 LAB — LIPASE, BLOOD: Lipase: 29 U/L (ref 11–51)

## 2018-02-27 MED ORDER — POTASSIUM CHLORIDE CRYS ER 20 MEQ PO TBCR
40.0000 meq | EXTENDED_RELEASE_TABLET | Freq: Once | ORAL | Status: AC
  Administered 2018-02-27: 40 meq via ORAL
  Filled 2018-02-27: qty 2

## 2018-02-27 MED ORDER — ONDANSETRON HCL 4 MG/2ML IJ SOLN
4.0000 mg | Freq: Once | INTRAMUSCULAR | Status: AC
  Administered 2018-02-27: 4 mg via INTRAVENOUS
  Filled 2018-02-27: qty 2

## 2018-02-27 MED ORDER — ONDANSETRON 4 MG PO TBDP: ORAL_TABLET | ORAL | 0 refills | Status: DC

## 2018-02-27 MED ORDER — SODIUM CHLORIDE 0.9 % IV BOLUS
1000.0000 mL | Freq: Once | INTRAVENOUS | Status: AC
  Administered 2018-02-27: 1000 mL via INTRAVENOUS

## 2018-02-27 MED ORDER — ACETAMINOPHEN 500 MG PO TABS
1000.0000 mg | ORAL_TABLET | Freq: Once | ORAL | Status: AC
  Administered 2018-02-27: 1000 mg via ORAL
  Filled 2018-02-27: qty 2

## 2018-02-27 NOTE — ED Notes (Signed)
Patient give drink, states that he is ok, I does not need anything to drink.

## 2018-02-27 NOTE — ED Notes (Signed)
Patient transported to x-ray. ?

## 2018-02-27 NOTE — ED Provider Notes (Signed)
[facility]   CSN: [id number] Arrival date & time: 02/27/18  1759     History   Chief Complaint Chief Complaint  Patient presents with  . Fever    HPI Alexandria Beltran is a 28 y.o. female.  Alexandria Beltran is a 28 y.o. female with a history of asthma, anemia, scoliosis and chronic back pain, who presents to the emergency department for evaluation of fevers, chills, headache, cough, body aches, nausea and vomiting.  Symptoms started last night.  Symptoms were sudden in onset and have been constant and worsening since then.  No associated chest pain or shortness of breath.  She reports last night she had some epigastric abdominal discomfort this was primarily present when her nausea got bad and then seemed to go away.  She did have 2 episodes of vomiting, has not tried to eat or drink anything since then for fear of vomiting again.  Denies any dysuria or urinary frequency.  No diarrhea or constipation.  No vaginal discharge.  She reports she has not taken anything for her symptoms since they started, denies any other aggravating or alleviating factors.  She did have her flu shot this year.  Reports she has been in contact with multiple people with the flu and other illnesses at work.     Past Medical History:  Diagnosis Date  . Anemia   . Asthma    Inhaler used 07/22/16  . Chronic back pain   . Gestational diabetes   . Scoliosis     Patient Active Problem List   Diagnosis Date Noted  . Anemia 06/26/2017  . Gestational diabetes 06/25/2017  . NSVD (normal spontaneous vaginal delivery) 06/25/2017  . Supervision of high risk pregnancy, antepartum 04/05/2017  . Asthma affecting pregnancy in third trimester 04/05/2017  . GDM, class A2 04/05/2017    Past Surgical History:  Procedure Laterality Date  . NO PAST SURGERIES       OB History    Gravida  2   Para  2   Term  2   Preterm      AB      Living  2     SAB      TAB       Ectopic      Multiple  0   Live Births  2            Home Medications    Prior to Admission medications   Medication Sig Start Date End Date Taking? Authorizing Provider  ACCU-CHEK FASTCLIX LANCETS MISC 1 each by Does not apply route 4 (four) times daily. Patient not taking: Reported on 12/24/2017 05/17/17   Levie HeritageStinson, Jacob J, DO  albuterol (PROVENTIL HFA;VENTOLIN HFA) 108 [id number](90 Base) MCG/ACT inhaler Inhale 2 puffs into the lungs every 6 (six) hours as needed for wheezing or shortness of breath. 05/17/17   Levie HeritageStinson, Jacob J, DO  albuterol (PROVENTIL) (2.5 MG/3ML) 0.083% nebulizer solution Take 3 mLs (2.5 mg total) by nebulization every 6 (six) hours as needed for wheezing or shortness of breath. 12/24/17   Kallie LocksStroud, Natalie M, FNP  FLUoxetine (PROZAC) 20 MG capsule Take 1 capsule (20 mg total) by mouth daily. Patient not taking: Reported on 07/26/2017 05/17/17   Levie HeritageStinson, Jacob J, DO  glucose blood (ACCU-CHEK GUIDE) test strip 1 each by Other route 4 (four) times daily. Use as instructed Patient not taking: Reported on 12/24/2017 05/17/17   Levie HeritageStinson, Jacob J, DO  ondansetron (ZOFRAN ODT) 4  MG disintegrating tablet 4mg  ODT q4 hours prn nausea/vomit 02/27/18   Dartha LodgeFord, Kenzie Flakes N, PA-C  Prenatal Vit-Fe Fumarate-FA (PREPLUS) 27-1 MG TABS Take 1 tablet by mouth daily. 05/15/16   [provider]  traMADol (ULTRAM) 50 MG tablet Take 1 tablet (50 mg total) by mouth every 8 (eight) hours as needed. 01/31/18   Kallie LocksStroud, Natalie M, FNP  triamcinolone ointment (KENALOG) 0.5 % Apply 1 application topically 2 (two) times daily. 10/04/17   Kallie LocksStroud, Natalie M, FNP    Family History Family History  Problem Relation Age of Onset  . Cancer Mother   . Diabetes Mother   . Alcohol abuse Mother   . Alcohol abuse Maternal Aunt   . Cancer Maternal Uncle   . Alcohol abuse Maternal Uncle     Social History Social History   Tobacco Use  . Smoking status: Former Smoker    Packs/day: 0.25    Years: 6.00    Pack  years: 1.50    Types: Cigarettes    Last attempt to quit: 2018    Years since quitting: 2.0  . Smokeless tobacco: Never Used  Substance Use Topics  . Alcohol use: No    Frequency: Never    Comment: occasionally (not with pregnancy)  . Drug use: No     Allergies   Bee pollen and Pollen extract   Review of Systems Review of Systems  Constitutional: Positive for chills and fever.  HENT: Positive for congestion, postnasal drip, rhinorrhea and sore throat. Negative for ear pain.   Eyes: Negative for visual disturbance.  Respiratory: Positive for cough. Negative for chest tightness and shortness of breath.   Cardiovascular: Negative for chest pain.  Gastrointestinal: Positive for nausea and vomiting. Negative for abdominal pain, constipation and diarrhea.  Genitourinary: Negative for dysuria, frequency, vaginal bleeding and vaginal discharge.  Musculoskeletal: Positive for myalgias. Negative for arthralgias, neck pain and neck stiffness.  Skin: Negative for color change and rash.  Neurological: Positive for headaches. Negative for dizziness, syncope and light-headedness.     Physical Exam Updated Vital Signs BP 136/85 (BP Location: Right Arm)   Pulse (!) 112   Temp (!) 101.8 F (38.8 C) (Oral)   Resp 18   Ht 5\' 11"  (1.803 m)   Wt 122.5 kg   SpO2 99%   BMI 37.66 kg/m   Physical Exam Vitals signs and nursing note reviewed.  Constitutional:      General: She is not in acute distress.    Appearance: She is well-developed. She is not ill-appearing or diaphoretic.  HENT:     Head: Normocephalic and atraumatic.     Right Ear: Tympanic membrane normal.     Left Ear: Tympanic membrane normal.     Nose: Congestion and rhinorrhea present.     Comments: Nasal mucosa is erythematous and edematous with clear rhinorrhea present    Mouth/Throat:     Mouth: Mucous membranes are moist.     Pharynx: Oropharynx is clear. Posterior oropharyngeal erythema present. No oropharyngeal  exudate.     Comments: Posterior oropharynx clear and moist with mild erythema, no edema or tonsillar exudates, uvula midline, normal phonation, tolerating secretions without difficulty Eyes:     General:        Right eye: No discharge.        Left eye: No discharge.  Neck:     Musculoskeletal: Neck supple.     Comments: No rigidity Cardiovascular:     Rate and Rhythm: Regular rhythm. Tachycardia present.  Pulses: Normal pulses.     Heart sounds: Normal heart sounds. No murmur. No friction rub. No gallop.      Comments: Mild tachycardia with regular rhythm Pulmonary:     Effort: Pulmonary effort is normal. No respiratory distress.     Breath sounds: Normal breath sounds.     Comments: Respirations equal and unlabored, patient able to speak in full sentences, lungs clear to auscultation bilaterally Abdominal:     General: Abdomen is flat. Bowel sounds are normal. There is no distension.     Palpations: Abdomen is soft. There is no mass.     Tenderness: There is abdominal tenderness. There is no guarding.     Comments: Abdomen is soft, nondistended, bowel sounds present throughout, there is some mild tenderness in the epigastrium and periumbilical regions without any guarding or rebound tenderness, no rigidity, no lower abdominal tenderness.  No CVA tenderness bilaterally.  Musculoskeletal:        General: No deformity.     Right lower leg: No edema.     Left lower leg: No edema.  Lymphadenopathy:     Cervical: No cervical adenopathy.  Skin:    General: Skin is warm and dry.     Capillary Refill: Capillary refill takes less than 2 seconds.     Findings: No rash.  Neurological:     Mental Status: She is alert and oriented to person, place, and time. Mental status is at baseline.  Psychiatric:        Mood and Affect: Mood normal.        Behavior: Behavior normal.      ED Treatments / Results  Labs (all labs ordered are listed, but only abnormal results are displayed) Labs  Reviewed  COMPREHENSIVE METABOLIC PANEL - Abnormal; Notable for the following components:      Result Value   Sodium 132 (*)    Potassium 3.3 (*)    Glucose, Bld 100 (*)    Calcium 8.1 (*)    All other components within normal limits  URINALYSIS, ROUTINE W REFLEX MICROSCOPIC - Abnormal; Notable for the following components:   Color, Urine STRAW (*)    All other components within normal limits  LIPASE, BLOOD  CBC WITH DIFFERENTIAL/PLATELET  INFLUENZA PANEL BY PCR (TYPE A & B)  I-STAT BETA HCG BLOOD, ED ([facility], [facility], [facility] ONLY)    EKG None  Radiology Dg Chest 2 View  Result Date: 02/27/2018 CLINICAL DATA:  Fever, chills, cough, nausea vomiting beginning last night. EXAM: CHEST - 2 VIEW COMPARISON:  Chest radiograph June 22, 2016 FINDINGS: Cardiomediastinal silhouette is normal. No pleural effusions or focal consolidations. Trachea projects midline and there is no pneumothorax. Soft tissue planes and included osseous structures are non-suspicious. Similar scoliosis. IMPRESSION: Negative. Electronically Signed   By: Awilda Metro M.D.   On: 02/27/2018 19:17    Procedures Procedures (including critical care time)  Medications Ordered in ED Medications  ondansetron (ZOFRAN) injection 4 mg (4 mg Intravenous Given 02/27/18 1854)  sodium chloride 0.9 % bolus 1,000 mL (0 mLs Intravenous Stopped 02/27/18 1947)  acetaminophen (TYLENOL) tablet 1,000 mg (1,000 mg Oral Given 02/27/18 1845)  potassium chloride SA (K-DUR,KLOR-CON) CR tablet 40 mEq (40 mEq Oral Given 02/27/18 1951)     Initial Impression / Assessment and Plan / ED Course  I have reviewed the triage vital signs and the nursing notes.  Pertinent labs & imaging results that were available during my care of the patient were reviewed by me  and considered in my medical decision making (see chart for details).  Patient presents for evaluation of fevers, chills, generalized body aches, cough, congestion, headaches, nausea and vomiting.  She  also reports some mild epigastric abdominal discomfort.  Has not had anything to eat or drink today for fear of vomiting it up.  On arrival she is febrile at 101.8 mildly tachycardic, appears as though she does not feel well but is nontoxic appearing.  Lungs are clear to auscultation abdomen with some mild epigastric and periumbilical tenderness with no focal guarding, no signs of a surgical abdomen.  I suspect viral etiology, will send off influenza testing and abdominal labs, also get chest x-ray.  Will give IV fluids, Tylenol and Zofran.  Labs overall reassuring, no leukocytosis and normal hemoglobin, sodium is slightly low at 132 and potassium is 3.3, given IV fluids and p.o. potassium replacement.,  Normal renal and liver function, normal lipase.  Negative pregnancy.  No signs of urinary tract infection.  Influenza panel is negative.  Chest x-ray with no active cardiopulmonary disease.  On reevaluation patient's fever has resolved as well as her tachycardia and she reports feeling much better.  No abdominal tenderness on repeat exam.  Suspect symptoms are due to viral etiology.  Will discharge home with symptomatic treatment.  Patient to follow-up with PCP.  Return precautions discussed.  Patient expresses understanding and agreement with plan.  Discharged home in good condition.  Vitals:   02/27/18 1810 02/27/18 1812 02/27/18 2052  BP: 136/85  127/69  Pulse: (!) 112  91  Resp: 18  (!) 22  Temp: (!) 101.8 F (38.8 C)  98.4 F (36.9 C)  TempSrc: Oral  Oral  SpO2: 99%  95%  Weight:  122.5 kg   Height:  5\' 11"  (1.803 m)      Final Clinical Impressions(s) / ED Diagnoses   Final diagnoses:  Influenza-like illness  Non-intractable vomiting with nausea, unspecified vomiting type  Abdominal discomfort    ED Discharge Orders         Ordered    ondansetron (ZOFRAN ODT) 4 MG disintegrating tablet     02/27/18 2110           Dartha Lodge, PA-C 02/27/18 2140    Doug Sou,  MD 03/01/18 [phone]

## 2018-02-27 NOTE — Discharge Instructions (Signed)
Your symptoms are likely caused by a viral infection. Antibiotics are not helpful in treating viral infection, the virus should run its course in about 5-7 days. Please make sure you are drinking plenty of fluids. You can treat your symptoms supportively with tylenol/ibuprofen for fevers and pains, Zyrtec and Flonase to help with nasal congestion, and over the counter cough syrups and throat lozenges to help with cough. Zofran as needed for nausea. If your symptoms are not improving please follow up with you Primary doctor.   If you develop persistent fevers, shortness of breath or difficulty breathing, chest pain, severe headache and neck pain, persistent nausea and vomiting or other new or concerning symptoms return to the Emergency department.

## 2018-02-27 NOTE — ED Triage Notes (Signed)
Patient to ED c/o fever/chills, headache, non-productive dry cough, and N/V since last night. She states she has not taken OTC medications yet because of her nausea. Also endorsing bilateral leg pain, denies injury.

## 2018-03-06 ENCOUNTER — Other Ambulatory Visit: Payer: Self-pay | Admitting: Family Medicine

## 2018-03-06 DIAGNOSIS — J4520 Mild intermittent asthma, uncomplicated: Secondary | ICD-10-CM

## 2018-03-07 ENCOUNTER — Other Ambulatory Visit: Payer: Self-pay | Admitting: Family Medicine

## 2018-03-07 DIAGNOSIS — M549 Dorsalgia, unspecified: Secondary | ICD-10-CM

## 2018-03-07 DIAGNOSIS — S39012A Strain of muscle, fascia and tendon of lower back, initial encounter: Secondary | ICD-10-CM

## 2018-03-28 ENCOUNTER — Telehealth: Payer: Self-pay

## 2018-03-28 ENCOUNTER — Other Ambulatory Visit: Payer: Self-pay | Admitting: Family Medicine

## 2018-03-28 DIAGNOSIS — S39012A Strain of muscle, fascia and tendon of lower back, initial encounter: Secondary | ICD-10-CM

## 2018-03-28 DIAGNOSIS — M549 Dorsalgia, unspecified: Secondary | ICD-10-CM

## 2018-03-28 MED ORDER — TRAMADOL HCL 50 MG PO TABS: 50.0000 mg | ORAL_TABLET | Freq: Three times a day (TID) | ORAL | 0 refills | Status: DC | PRN

## 2018-03-29 NOTE — Telephone Encounter (Signed)
Left a vm for patient

## 2018-04-20 ENCOUNTER — Other Ambulatory Visit: Payer: Self-pay | Admitting: Family Medicine

## 2018-04-20 DIAGNOSIS — S39012A Strain of muscle, fascia and tendon of lower back, initial encounter: Secondary | ICD-10-CM

## 2018-04-20 DIAGNOSIS — M549 Dorsalgia, unspecified: Secondary | ICD-10-CM

## 2018-04-20 DIAGNOSIS — J4520 Mild intermittent asthma, uncomplicated: Secondary | ICD-10-CM

## 2018-05-09 ENCOUNTER — Other Ambulatory Visit: Payer: Self-pay | Admitting: Family Medicine

## 2018-05-09 ENCOUNTER — Telehealth: Payer: Self-pay

## 2018-05-09 DIAGNOSIS — M549 Dorsalgia, unspecified: Secondary | ICD-10-CM

## 2018-05-09 MED ORDER — CYCLOBENZAPRINE HCL 10 MG PO TABS: 10.0000 mg | ORAL_TABLET | Freq: Three times a day (TID) | ORAL | 2 refills | Status: DC | PRN

## 2018-05-10 NOTE — Telephone Encounter (Signed)
Tried to contact patient no answer 

## 2018-05-11 NOTE — Telephone Encounter (Signed)
Patient notified

## 2018-05-12 ENCOUNTER — Encounter (HOSPITAL_COMMUNITY): Payer: Self-pay | Admitting: Emergency Medicine

## 2018-05-12 ENCOUNTER — Other Ambulatory Visit: Payer: Self-pay

## 2018-05-12 ENCOUNTER — Emergency Department (HOSPITAL_COMMUNITY)
Admission: EM | Admit: 2018-05-12 | Discharge: 2018-05-12 | Disposition: A | Discharge status: Home / Self Care | Payer: Medicaid Other | Attending: Emergency Medicine | Admitting: Emergency Medicine

## 2018-05-12 DIAGNOSIS — Z79899 Other long term (current) drug therapy: Secondary | ICD-10-CM | POA: Insufficient documentation

## 2018-05-12 DIAGNOSIS — J069 Acute upper respiratory infection, unspecified: Secondary | ICD-10-CM | POA: Diagnosis not present

## 2018-05-12 DIAGNOSIS — J45909 Unspecified asthma, uncomplicated: Secondary | ICD-10-CM | POA: Insufficient documentation

## 2018-05-12 DIAGNOSIS — Z87891 Personal history of nicotine dependence: Secondary | ICD-10-CM | POA: Diagnosis not present

## 2018-05-12 DIAGNOSIS — R509 Fever, unspecified: Secondary | ICD-10-CM | POA: Diagnosis present

## 2018-05-12 LAB — GROUP A STREP BY PCR: Group A Strep by PCR: NOT DETECTED

## 2018-05-12 MED ORDER — ACETAMINOPHEN 325 MG PO TABS: 650.0000 mg | ORAL_TABLET | Freq: Once | ORAL | Status: DC | PRN

## 2018-05-12 NOTE — ED Triage Notes (Signed)
Pt reports a fever, cough, and sore throat. Pt reports taking her temperature at work and same was 101. Pt reports taking tylenol at 2300 hrs last night.

## 2018-05-12 NOTE — ED Notes (Signed)
Pt transported to xray 

## 2018-05-12 NOTE — Discharge Instructions (Addendum)
Over the counter medications as needed for cold like symptoms.   Please follow up with PCP in 2-3 days.   Return to ED sooner if needed.

## 2018-05-12 NOTE — ED Provider Notes (Signed)
[facility] EMERGENCY DEPARTMENT Provider Note   CSN: [id number] Arrival date & time: 05/12/18  0855    History   Chief Complaint Chief Complaint  Patient presents with  . Fever  . Cough  . Sore Throat   HPI Alexandria Beltran is a 28 y.o. female.     The history is provided by the patient. No language interpreter was used.   Pt presents today stating fever, sore throat, nasal congestion, mild ha and low grade fever since last night.   She has PMH of asthma, uses inhaler and continues to smoke cigarettes.   Fever at home reported 100.1, relieved with tylenol.   She denies dizziness, neck pain/stiffness, cp, shob, abd pain, n/v/d, urinary symptoms.   She denies any recent travel or exposure to COVID 19.    She does not appear to be in any distress.   Past Medical History:  Diagnosis Date  . Anemia   . Asthma    Inhaler used 07/22/16  . Chronic back pain   . Gestational diabetes   . Scoliosis     Patient Active Problem List   Diagnosis Date Noted  . Anemia 06/26/2017  . Gestational diabetes 06/25/2017  . NSVD (normal spontaneous vaginal delivery) 06/25/2017  . Supervision of high risk pregnancy, antepartum 04/05/2017  . Asthma affecting pregnancy in third trimester 04/05/2017  . GDM, class A2 04/05/2017    Past Surgical History:  Procedure Laterality Date  . NO PAST SURGERIES       OB History    Gravida  2   Para  2   Term  2   Preterm      AB      Living  2     SAB      TAB      Ectopic      Multiple  0   Live Births  2            Home Medications    Prior to Admission medications   Medication Sig Start Date End Date Taking? Authorizing Provider  ACCU-CHEK FASTCLIX LANCETS MISC 1 each by Does not apply route 4 (four) times daily. Patient not taking: Reported on 12/24/2017 05/17/17   Levie Heritage, DO  albuterol (PROVENTIL) (2.5 MG/3ML) 0.083% nebulizer solution Take 3 mLs (2.5 mg total) by nebulization every 6 (six)  hours as needed for wheezing or shortness of breath. 12/24/17   Kallie Locks, FNP  cyclobenzaprine (FLEXERIL) 10 MG tablet Take 1 tablet (10 mg total) by mouth 3 (three) times daily as needed for muscle spasms. 05/09/18   Kallie Locks, FNP  FLUoxetine (PROZAC) 20 MG capsule Take 1 capsule (20 mg total) by mouth daily. Patient not taking: Reported on 07/26/2017 05/17/17   Levie Heritage, DO  glucose blood (ACCU-CHEK GUIDE) test strip 1 each by Other route 4 (four) times daily. Use as instructed Patient not taking: Reported on 12/24/2017 05/17/17   Levie Heritage, DO  ondansetron (ZOFRAN ODT) 4 MG disintegrating tablet 4mg  ODT q4 hours prn nausea/vomit 02/27/18   Dartha Lodge, PA-C  Prenatal Vit-Fe Fumarate-FA (PREPLUS) 27-1 MG TABS Take 1 tablet by mouth daily. 05/15/16   [provider]  PROAIR HFA 108 (90 Base) MCG/ACT inhaler INHALE 2 PUFFS INTO THE LUNGS EVERY 6 HOURS AS NEEDED FOR WHEEZING OR SHORTNESS OF BREATH 04/21/18   Levie Heritage, DO  traMADol (ULTRAM) 50 MG tablet Take 1 tablet (50 mg total) by mouth  every 8 (eight) hours as needed. 03/28/18   Kallie Locks, FNP  triamcinolone ointment (KENALOG) 0.5 % Apply 1 application topically 2 (two) times daily. 10/04/17   Kallie Locks, FNP    Family History Family History  Problem Relation Age of Onset  . Cancer Mother   . Diabetes Mother   . Alcohol abuse Mother   . Alcohol abuse Maternal Aunt   . Cancer Maternal Uncle   . Alcohol abuse Maternal Uncle     Social History Social History   Tobacco Use  . Smoking status: Former Smoker    Packs/day: 0.25    Years: 6.00    Pack years: 1.50    Types: Cigarettes    Last attempt to quit: 2018    Years since quitting: 2.2  . Smokeless tobacco: Never Used  Substance Use Topics  . Alcohol use: No    Frequency: Never    Comment: occasionally (not with pregnancy)  . Drug use: No     Allergies   Bee pollen and Pollen extract   Review of Systems Review of  Systems  Constitutional: Positive for fever.  HENT: Positive for congestion and sore throat.   Respiratory: Positive for cough.   Neurological: Positive for headaches.  All other systems reviewed and are negative.    Physical Exam Updated Vital Signs BP 121/69 (BP Location: Right Arm)   Pulse 89   Temp 99.5 F (37.5 C) (Oral)   Resp 18   Ht 6' (1.829 m)   Wt 117.9 kg   LMP  (Exact Date)   SpO2 95%   BMI 35.26 kg/m   Physical Exam Vitals signs and nursing note reviewed.  Constitutional:      General: She is not in acute distress.    Appearance: She is well-developed.  HENT:     Head: Normocephalic and atraumatic.     Right Ear: Tympanic membrane normal.     Left Ear: Tympanic membrane normal.     Nose: Nose normal.     Mouth/Throat:     Mouth: Mucous membranes are moist.  Eyes:     Conjunctiva/sclera: Conjunctivae normal.  Neck:     Musculoskeletal: Neck supple.  Cardiovascular:     Rate and Rhythm: Normal rate and regular rhythm.     Heart sounds: No murmur.  Pulmonary:     Effort: Pulmonary effort is normal. No respiratory distress.     Breath sounds: Normal breath sounds.  Abdominal:     Palpations: Abdomen is soft.     Tenderness: There is no abdominal tenderness.  Skin:    General: Skin is warm and dry.  Neurological:     Mental Status: She is alert.      ED Treatments / Results  Labs (all labs ordered are listed, but only abnormal results are displayed) Labs Reviewed  GROUP A STREP BY PCR    EKG None  Radiology No results found.  Procedures Procedures (including critical care time)  Medications Ordered in ED Medications  acetaminophen (TYLENOL) tablet 650 mg (has no administration in time range)     Initial Impression / Assessment and Plan / ED Course  I have reviewed the triage vital signs and the nursing notes.  Pertinent labs & imaging results that were available during my care of the patient were reviewed by me and considered  in my medical decision making (see chart for details).      Pt presenting with low grade fever, sore throat and upper  respiratory symptoms.  Strep negative.   Likely viral URI;   She has no known exposure to COVID 19 and has not traveled.  Counseled pt on OTC meds as needed for cold like symptoms, but currently there is no need to self quarantine for 14 days.   Strict follow up and return precautions given.    Final Clinical Impressions(s) / ED Diagnoses   Final diagnoses:  Viral upper respiratory tract infection    ED Discharge Orders    None       Marquice Uddin, Binnie Rail, PA-C 05/12/18 1032    Azalia Bilis, MD 05/12/18 1045

## 2018-05-14 ENCOUNTER — Other Ambulatory Visit: Payer: Self-pay

## 2018-05-14 ENCOUNTER — Emergency Department (HOSPITAL_COMMUNITY)
Admission: EM | Admit: 2018-05-14 | Discharge: 2018-05-14 | Disposition: A | Discharge status: Home / Self Care | Payer: Medicaid Other | Attending: Emergency Medicine | Admitting: Emergency Medicine

## 2018-05-14 ENCOUNTER — Encounter (HOSPITAL_COMMUNITY): Payer: Self-pay

## 2018-05-14 DIAGNOSIS — J40 Bronchitis, not specified as acute or chronic: Secondary | ICD-10-CM

## 2018-05-14 DIAGNOSIS — Z79899 Other long term (current) drug therapy: Secondary | ICD-10-CM | POA: Insufficient documentation

## 2018-05-14 DIAGNOSIS — R05 Cough: Secondary | ICD-10-CM | POA: Diagnosis present

## 2018-05-14 DIAGNOSIS — J45909 Unspecified asthma, uncomplicated: Secondary | ICD-10-CM | POA: Insufficient documentation

## 2018-05-14 DIAGNOSIS — R0981 Nasal congestion: Secondary | ICD-10-CM | POA: Insufficient documentation

## 2018-05-14 DIAGNOSIS — F17200 Nicotine dependence, unspecified, uncomplicated: Secondary | ICD-10-CM | POA: Diagnosis not present

## 2018-05-14 MED ORDER — IPRATROPIUM-ALBUTEROL 0.5-2.5 (3) MG/3ML IN SOLN
3.0000 mL | Freq: Once | RESPIRATORY_TRACT | Status: AC
  Administered 2018-05-14: 3 mL via RESPIRATORY_TRACT
  Filled 2018-05-14: qty 3

## 2018-05-14 MED ORDER — PREDNISONE 10 MG PO TABS: 40.0000 mg | ORAL_TABLET | Freq: Every day | ORAL | 0 refills | Status: DC

## 2018-05-14 MED ORDER — AZITHROMYCIN 250 MG PO TABS: 250.0000 mg | ORAL_TABLET | Freq: Every day | ORAL | 0 refills | Status: DC

## 2018-05-14 NOTE — ED Notes (Signed)
Patient Alert and oriented to baseline. Stable and ambulatory to baseline. Patient verbalized understanding of the discharge instructions.  Patient belongings were taken by the patient.   

## 2018-05-14 NOTE — Discharge Instructions (Signed)
Use your inhaler as needed. Take the cough medications you are currently taking in addition to the medications we give you tonight. Follow up with your doctor in a few days for recheck. Return here as needed.

## 2018-05-14 NOTE — ED Triage Notes (Signed)
Pt seen here on 3/19 for URI, sent home to rest/recover.  Pt states no improvement since last visit.  No new pain.  A&Ox4

## 2018-05-14 NOTE — ED Provider Notes (Signed)
[facility] EMERGENCY DEPARTMENT Provider Note   CSN: [id number] Arrival date & time: 05/14/18  2046    History   Chief Complaint Chief Complaint  Patient presents with  . URI    HPI Alexandria Beltran is a 28 y.o. female who presents to the ED for cough and congestion. Patient has a hx of asthma and is an every day smoker. Patient reports she was here a few days ago and told to go home and take OTC medications and drink fluids. Patient reports the congestion is worse. Patient taking OTC cough medication and decongestant. Patient denies travel or contact with anyone with known COVID 19.      HPI  Past Medical History:  Diagnosis Date  . Anemia   . Asthma    Inhaler used 07/22/16  . Chronic back pain   . Gestational diabetes   . Scoliosis     Patient Active Problem List   Diagnosis Date Noted  . Anemia 06/26/2017  . Gestational diabetes 06/25/2017  . NSVD (normal spontaneous vaginal delivery) 06/25/2017  . Supervision of high risk pregnancy, antepartum 04/05/2017  . Asthma affecting pregnancy in third trimester 04/05/2017  . GDM, class A2 04/05/2017    Past Surgical History:  Procedure Laterality Date  . NO PAST SURGERIES       OB History    Gravida  2   Para  2   Term  2   Preterm      AB      Living  2     SAB      TAB      Ectopic      Multiple  0   Live Births  2            Home Medications    Prior to Admission medications   Medication Sig Start Date End Date Taking? Authorizing Provider  albuterol (PROVENTIL) (2.5 MG/3ML) 0.083% nebulizer solution Take 3 mLs (2.5 mg total) by nebulization every 6 (six) hours as needed for wheezing or shortness of breath. Patient not taking: Reported on 05/12/2018 12/24/17   Kallie Locks, FNP  azithromycin (ZITHROMAX) 250 MG tablet Take 1 tablet (250 mg total) by mouth daily. Take first 2 tablets together, then 1 every day until finished. 05/14/18   Janne Napoleon, NP  cyclobenzaprine  (FLEXERIL) 10 MG tablet Take 1 tablet (10 mg total) by mouth 3 (three) times daily as needed for muscle spasms. 05/09/18   Kallie Locks, FNP  FLUoxetine (PROZAC) 20 MG capsule Take 1 capsule (20 mg total) by mouth daily. Patient not taking: Reported on 07/26/2017 05/17/17   Levie Heritage, DO  glucose blood (ACCU-CHEK GUIDE) test strip 1 each by Other route 4 (four) times daily. Use as instructed Patient not taking: Reported on 12/24/2017 05/17/17   Levie Heritage, DO  predniSONE (DELTASONE) 10 MG tablet Take 4 tablets (40 mg total) by mouth daily with breakfast. 05/14/18   Janne Napoleon, NP  Prenatal Vit-Fe Fumarate-FA (PREPLUS) 27-1 MG TABS Take 1 tablet by mouth daily. 05/15/16   [provider]  PROAIR HFA 108 (90 Base) MCG/ACT inhaler INHALE 2 PUFFS INTO THE LUNGS EVERY 6 HOURS AS NEEDED FOR WHEEZING OR SHORTNESS OF BREATH Patient not taking: Reported on 05/12/2018 04/21/18   Levie Heritage, DO  traMADol (ULTRAM) 50 MG tablet Take 1 tablet (50 mg total) by mouth every 8 (eight) hours as needed. Patient not taking: Reported on 05/12/2018 03/28/18  Kallie Locks, FNP    Family History Family History  Problem Relation Age of Onset  . Cancer Mother   . Diabetes Mother   . Alcohol abuse Mother   . Alcohol abuse Maternal Aunt   . Cancer Maternal Uncle   . Alcohol abuse Maternal Uncle     Social History Social History   Tobacco Use  . Smoking status: Former Smoker    Packs/day: 0.25    Years: 6.00    Pack years: 1.50    Types: Cigarettes    Last attempt to quit: 2018    Years since quitting: 2.2  . Smokeless tobacco: Never Used  Substance Use Topics  . Alcohol use: No    Frequency: Never    Comment: occasionally (not with pregnancy)  . Drug use: No     Allergies   Bee pollen and Pollen extract   Review of Systems Review of Systems  Constitutional: Positive for chills. Negative for fever.  HENT: Positive for congestion, sinus pressure and sore throat.  Negative for trouble swallowing.   Eyes: Negative for discharge, redness and itching.  Respiratory: Positive for cough and wheezing. Negative for shortness of breath.   Cardiovascular: Negative for chest pain.  Gastrointestinal: Negative for abdominal pain, nausea and vomiting.  Musculoskeletal: Positive for myalgias.  Skin: Negative for rash.  Neurological: Negative for headaches.  Hematological: Negative for adenopathy.  Psychiatric/Behavioral: Negative for confusion.     Physical Exam Updated Vital Signs BP 129/84 (BP Location: Left Arm)   Pulse 100   Temp 98.2 F (36.8 C) (Oral)   Resp 18   SpO2 96%   Physical Exam Vitals signs and nursing note reviewed.  Constitutional:      Appearance: She is well-developed.  HENT:     Head: Normocephalic.     Nose: Congestion present.     Mouth/Throat:     Mouth: Mucous membranes are moist.     Pharynx: Posterior oropharyngeal erythema present. No oropharyngeal exudate.  Eyes:     Extraocular Movements: Extraocular movements intact.     Conjunctiva/sclera: Conjunctivae normal.  Neck:     Musculoskeletal: Neck supple. No neck rigidity.  Cardiovascular:     Rate and Rhythm: Normal rate and regular rhythm.  Pulmonary:     Effort: Pulmonary effort is normal. No respiratory distress.     Breath sounds: No wheezing or rales.     Comments: Examined after neb treatment Chest:     Chest wall: No tenderness.  Abdominal:     Palpations: Abdomen is soft.     Tenderness: There is no abdominal tenderness.  Musculoskeletal: Normal range of motion.  Lymphadenopathy:     Cervical: No cervical adenopathy.  Skin:    General: Skin is warm and dry.  Neurological:     Mental Status: She is alert and oriented to person, place, and time.  Psychiatric:        Mood and Affect: Mood normal.      ED Treatments / Results  Labs (all labs ordered are listed, but only abnormal results are displayed) Labs Reviewed - No data to display Radiology  No results found.  Procedures Procedures (including critical care time)  Medications Ordered in ED Medications  ipratropium-albuterol (DUONEB) 0.5-2.5 (3) MG/3ML nebulizer solution 3 mL (3 mLs Nebulization Given 05/14/18 2110)     Initial Impression / Assessment and Plan / ED Course  I have reviewed the triage vital signs and the nursing notes. 28 y.o. female here with cough  and congestion that has continued since last visit. Patient with hx of asthma and every day smoker. Will treat for asthmatic bronchitis with short steroid burst and Z-pak. Patient to f/u with PCP. She will continue her OTC medications and use her inhaler as needed. Return precautions discussed.   Final Clinical Impressions(s) / ED Diagnoses   Final diagnoses:  Bronchitis    ED Discharge Orders         Ordered    azithromycin (ZITHROMAX) 250 MG tablet  Daily     05/14/18 2128    predniSONE (DELTASONE) 10 MG tablet  Daily with breakfast     05/14/18 2128           Kerrie [place], [place] 05/14/18 2138    Terrilee Files, MD 05/14/18 2238

## 2018-05-25 ENCOUNTER — Encounter: Payer: Self-pay | Admitting: Family Medicine

## 2018-05-25 ENCOUNTER — Ambulatory Visit (INDEPENDENT_AMBULATORY_CARE_PROVIDER_SITE_OTHER): Payer: Medicaid Other | Admitting: Family Medicine

## 2018-05-25 ENCOUNTER — Other Ambulatory Visit: Payer: Self-pay

## 2018-05-25 VITALS — BP 108/72 | HR 90 | Temp 98.1°F | Ht 72.0 in | Wt 292.0 lb

## 2018-05-25 DIAGNOSIS — I10 Essential (primary) hypertension: Secondary | ICD-10-CM

## 2018-05-25 LAB — POCT URINALYSIS DIP (MANUAL ENTRY)
Bilirubin, UA: NEGATIVE
Blood, UA: NEGATIVE
Glucose, UA: NEGATIVE mg/dL
Ketones, POC UA: NEGATIVE mg/dL
Leukocytes, UA: NEGATIVE
Nitrite, UA: NEGATIVE
Protein Ur, POC: NEGATIVE mg/dL
Spec Grav, UA: 1.02 (ref 1.010–1.025)
Urobilinogen, UA: 0.2 E.U./dL
pH, UA: 6 (ref 5.0–8.0)

## 2018-05-25 NOTE — Progress Notes (Deleted)
[facility]   Established Patient Office Visit  Subjective:  Patient ID: Alexandria Beltran, female    DOB: Jul 15, 1990  Age: 28 y.o. MRN: [id number]  CC:  Chief Complaint  Patient presents with  . Follow-up    chronic condition    HPI Alexandria Beltran is a 28 year old female who presents for Follow Up today.   Past Medical History:  Diagnosis Date  . Anemia   . Asthma    Inhaler used 07/22/16  . Chronic back pain   . Gestational diabetes   . Scoliosis    Current Status: Since her last office visit, she has had a few ED visits for respiratory problems. Today she is doing well with no complaints.     She denies fevers, chills, fatigue, recent infections, weight loss, and night sweats. She has not had any headaches, visual changes, dizziness, and falls. No chest pain, heart palpitations, cough and shortness of breath reported. No reports of GI problems such as nausea, vomiting, diarrhea, and constipation. She has no reports of blood in stools, dysuria and hematuria. No depression or anxiety, and denies suicidal ideations, homicidal ideations, or auditory hallucinations. She denies pain today.   Past Surgical History:  Procedure Laterality Date  . NO PAST SURGERIES      Family History  Problem Relation Age of Onset  . Cancer Mother   . Diabetes Mother   . Alcohol abuse Mother   . Alcohol abuse Maternal Aunt   . Cancer Maternal Uncle   . Alcohol abuse Maternal Uncle     Social History   Socioeconomic History  . Marital status: Single    Spouse name: Not on file  . Number of children: Not on file  . Years of education: Not on file  . Highest education level: Not on file  Occupational History  . Not on file  Social Needs  . Financial resource strain: Not on file  . Food insecurity:    Worry: Not on file    Inability: Not on file  . Transportation needs:    Medical: Not on file    Non-medical: Not on file  Tobacco Use   . Smoking status: Current Some Day Smoker    Packs/day: 0.25    Years: 6.00    Pack years: 1.50    Types: Cigarettes    Last attempt to quit: 2018    Years since quitting: 2.2  . Smokeless tobacco: Never Used  Substance and Sexual Activity  . Alcohol use: No    Frequency: Never    Comment: occasionally (not with pregnancy)  . Drug use: No  . Sexual activity: Yes    Birth control/protection: None    Comment: undecided between two  Lifestyle  . Physical activity:    Days per week: Not on file    Minutes per session: Not on file  . Stress: Not on file  Relationships  . Social connections:    Talks on phone: Not on file    Gets together: Not on file    Attends religious service: Not on file    Active member of club or organization: Not on file    Attends meetings of clubs or organizations: Not on file    Relationship status: Not on file  . Intimate partner violence:    Fear of current or ex partner: Not on file    Emotionally abused: Not on file    Physically abused: Not  on file    Forced sexual activity: Not on file  Other Topics Concern  . Not on file  Social History Narrative  . Not on file    Outpatient Medications Prior to Visit  Medication Sig Dispense Refill  . albuterol (PROVENTIL) (2.5 MG/3ML) 0.083% nebulizer solution Take 3 mLs (2.5 mg total) by nebulization every 6 (six) hours as needed for wheezing or shortness of breath. 150 mL 12  . cyclobenzaprine (FLEXERIL) 10 MG tablet Take 1 tablet (10 mg total) by mouth 3 (three) times daily as needed for muscle spasms. 30 tablet 2  . glucose blood (ACCU-CHEK GUIDE) test strip 1 each by Other route 4 (four) times daily. Use as instructed 100 each 12  . PROAIR HFA 108 (90 Base) MCG/ACT inhaler INHALE 2 PUFFS INTO THE LUNGS EVERY 6 HOURS AS NEEDED FOR WHEEZING OR SHORTNESS OF BREATH 8.5 g 6  . FLUoxetine (PROZAC) 20 MG capsule Take 1 capsule (20 mg total) by mouth daily. (Patient not taking: Reported on 07/26/2017) 30  capsule 3  . Prenatal Vit-Fe Fumarate-FA (PREPLUS) 27-1 MG TABS Take 1 tablet by mouth daily.  0  . traMADol (ULTRAM) 50 MG tablet Take 1 tablet (50 mg total) by mouth every 8 (eight) hours as needed. (Patient not taking: Reported on 05/12/2018) 30 tablet 0  . azithromycin (ZITHROMAX) 250 MG tablet Take 1 tablet (250 mg total) by mouth daily. Take first 2 tablets together, then 1 every day until finished. 6 tablet 0  . predniSONE (DELTASONE) 10 MG tablet Take 4 tablets (40 mg total) by mouth daily with breakfast. 16 tablet 0   Facility-Administered Medications Prior to Visit  Medication Dose Route Frequency Provider Last Rate Last Dose  . polyethylene glycol (MIRALAX / GLYCOLAX) packet 17 g  17 g Oral Daily Adam PhenixArnold, James G, MD        Allergies  Allergen Reactions  . Bee Pollen   . Pollen Extract     ROS Review of Systems  Constitutional: Negative.   HENT: Negative.   Eyes: Negative.   Respiratory: Positive for shortness of breath (occasional).   Cardiovascular: Negative.   Gastrointestinal: Negative.   Endocrine: Negative.   Genitourinary: Negative.   Musculoskeletal: Negative.   Skin: Negative.   Allergic/Immunologic: Negative.   Neurological: Negative.   Hematological: Negative.   Psychiatric/Behavioral: Negative.       Objective:    Physical Exam  Constitutional: She is oriented to person, place, and time. She appears well-developed and well-nourished.  HENT:  Head: Normocephalic and atraumatic.  Eyes: Conjunctivae are normal.  Neck: Normal range of motion. Neck supple.  Cardiovascular: Normal rate, regular rhythm, normal heart sounds and intact distal pulses.  Pulmonary/Chest: Effort normal and breath sounds normal.  Abdominal: Soft. Bowel sounds are normal.  Musculoskeletal: Normal range of motion.  Neurological: She is alert and oriented to person, place, and time. She has normal reflexes.  Skin: Skin is warm and dry.  Psychiatric: She has a normal mood and  affect. Her behavior is normal. Judgment and thought content normal.  Nursing note and vitals reviewed.   BP 108/72 (BP Location: Left Arm, Patient Position: Sitting, Cuff Size: Large)   Pulse 90   Temp 98.1 F (36.7 C) (Oral)   Ht 6' (1.829 m)   Wt 292 lb (132.5 kg)   SpO2 98%   BMI 39.60 kg/m  Wt Readings from Last 3 Encounters:  05/25/18 292 lb (132.5 kg)  05/12/18 260 lb (117.9 kg)  02/27/18 270  lb (122.5 kg)     There are no preventive care reminders to display for this patient.  There are no preventive care reminders to display for this patient.  No results found for: TSH Lab Results  Component Value Date   WBC 7.6 02/27/2018   HGB 13.1 02/27/2018   HCT 42.0 02/27/2018   MCV 90.1 02/27/2018   PLT 284 02/27/2018   Lab Results  Component Value Date   NA 132 (L) 02/27/2018   K 3.3 (L) 02/27/2018   CO2 24 02/27/2018   GLUCOSE 100 (H) 02/27/2018   BUN 8 02/27/2018   CREATININE 0.70 02/27/2018   BILITOT 0.5 02/27/2018   ALKPHOS 51 02/27/2018   AST 21 02/27/2018   ALT 23 02/27/2018   PROT 8.0 02/27/2018   ALBUMIN 3.6 02/27/2018   CALCIUM 8.1 (L) 02/27/2018   ANIONGAP 7 02/27/2018   No results found for: CHOL No results found for: HDL No results found for: LDLCALC No results found for: TRIG No results found for: CHOLHDL Lab Results  Component Value Date   HGBA1C 5.2 09/01/2017      Assessment & Plan:   Problem List Items Addressed This Visit    None    Visit Diagnoses    Hypertension, unspecified type    -  Primary   Relevant Orders   POCT urinalysis dipstick      No orders of the defined types were placed in this encounter.   Follow-up: No follow-ups on file.    Kallie Locks, FNP

## 2018-06-01 ENCOUNTER — Ambulatory Visit: Payer: Self-pay | Admitting: Family Medicine

## 2018-06-08 ENCOUNTER — Ambulatory Visit (INDEPENDENT_AMBULATORY_CARE_PROVIDER_SITE_OTHER): Payer: Medicaid Other | Admitting: Family Medicine

## 2018-06-08 ENCOUNTER — Encounter: Payer: Self-pay | Admitting: Family Medicine

## 2018-06-08 ENCOUNTER — Other Ambulatory Visit: Payer: Self-pay

## 2018-06-08 DIAGNOSIS — M549 Dorsalgia, unspecified: Secondary | ICD-10-CM | POA: Diagnosis not present

## 2018-06-08 DIAGNOSIS — I10 Essential (primary) hypertension: Secondary | ICD-10-CM | POA: Diagnosis not present

## 2018-06-08 DIAGNOSIS — R0602 Shortness of breath: Secondary | ICD-10-CM

## 2018-06-08 DIAGNOSIS — Z09 Encounter for follow-up examination after completed treatment for conditions other than malignant neoplasm: Secondary | ICD-10-CM | POA: Diagnosis not present

## 2018-06-08 MED ORDER — NAPROXEN 500 MG PO TABS: 500.0000 mg | ORAL_TABLET | Freq: Two times a day (BID) | ORAL | 3 refills | Status: DC

## 2018-06-08 NOTE — Progress Notes (Signed)
Virtual Visit via Telephone Note  I connected with Alexandria Beltran on 06/08/18 at 11:20 AM EDT by telephone and verified that I am speaking with the correct person using two identifiers.   I discussed the limitations, risks, security and privacy concerns of performing an evaluation and management service by telephone and the availability of in person appointments. I also discussed with the patient that there may be a patient responsible charge related to this service. The patient expressed understanding and agreed to proceed.   History of Present Illness:  Past Medical History:  Diagnosis Date  . Anemia   . Anxiety   . Asthma    Inhaler used 07/22/16  . Chronic back pain   . Gestational diabetes   . Scoliosis     Current Outpatient Medications on File Prior to Visit  Medication Sig Dispense Refill  . cyclobenzaprine (FLEXERIL) 10 MG tablet Take 1 tablet (10 mg total) by mouth 3 (three) times daily as needed for muscle spasms. 30 tablet 2  . PROAIR HFA 108 (90 Base) MCG/ACT inhaler INHALE 2 PUFFS INTO THE LUNGS EVERY 6 HOURS AS NEEDED FOR WHEEZING OR SHORTNESS OF BREATH 8.5 g 6  . albuterol (PROVENTIL) (2.5 MG/3ML) 0.083% nebulizer solution Take 3 mLs (2.5 mg total) by nebulization every 6 (six) hours as needed for wheezing or shortness of breath. (Patient not taking: Reported on 06/08/2018) 150 mL 12  . FLUoxetine (PROZAC) 20 MG capsule Take 1 capsule (20 mg total) by mouth daily. (Patient not taking: Reported on 07/26/2017) 30 capsule 3  . traMADol (ULTRAM) 50 MG tablet Take 1 tablet (50 mg total) by mouth every 8 (eight) hours as needed. (Patient not taking: Reported on 05/12/2018) 30 tablet 0   No current facility-administered medications on file prior to visit.       Observations/Objective:  Telephone Virtual Visit   Assessment and Plan:  1. Hypertension, unspecified type She will continue to decrease high sodium intake, excessive alcohol intake, increase potassium intake, smoking  cessation, and increase physical activity of at least 30 minutes of cardio activity daily. She will continue to follow Heart Healthy or DASH diet.  2. Back pain, unspecified back location, unspecified back pain laterality, unspecified chronicity - naproxen (NAPROSYN) 500 MG tablet; Take 1 tablet (500 mg total) by mouth 2 (two) times daily with a meal.  Dispense: 30 tablet; Refill: 3  3. Shortness of breath Stable today. No reports of respiratory distress.   Meds ordered this encounter  Medications  . naproxen (NAPROSYN) 500 MG tablet    Sig: Take 1 tablet (500 mg total) by mouth 2 (two) times daily with a meal.    Dispense:  30 tablet    Refill:  3    No orders of the defined types were placed in this encounter.   Referral Orders  No referral(s) requested today   Raliegh Ip,  MSN, FNP-C [facility] [address] [place], [place] [address] [phone]    Follow Up Instructions:  She will follow up in 6 months.    I discussed the assessment and treatment plan with the patient. The patient was provided an opportunity to ask questions and all were answered. The patient agreed with the plan and demonstrated an understanding of the instructions.   The patient was advised to call back or seek an in-person evaluation if the symptoms worsen or if the condition fails to improve as anticipated.  I provided 15-20 minutes of non-face-to-face time  during this encounter.   Kallie LocksNatalie M Oakley Orban, FNP

## 2018-06-09 DIAGNOSIS — M549 Dorsalgia, unspecified: Secondary | ICD-10-CM | POA: Insufficient documentation

## 2018-06-09 DIAGNOSIS — I10 Essential (primary) hypertension: Secondary | ICD-10-CM | POA: Insufficient documentation

## 2018-06-13 ENCOUNTER — Telehealth: Payer: Self-pay

## 2018-06-14 ENCOUNTER — Other Ambulatory Visit: Payer: Self-pay | Admitting: Family Medicine

## 2018-06-14 DIAGNOSIS — M549 Dorsalgia, unspecified: Secondary | ICD-10-CM

## 2018-06-14 MED ORDER — TIZANIDINE HCL 4 MG PO CAPS: 4.0000 mg | ORAL_CAPSULE | Freq: Three times a day (TID) | ORAL | 1 refills | Status: DC | PRN

## 2018-06-14 NOTE — Telephone Encounter (Signed)
Left a vm for patient to callback 

## 2018-06-14 NOTE — Telephone Encounter (Signed)
Patient states that the Naprosyn and Cyclobenzaprine are not working for her back and would like to have something else sent in.

## 2018-06-15 NOTE — Telephone Encounter (Signed)
Left a vm for patient to callback 

## 2018-06-15 NOTE — Telephone Encounter (Signed)
Patient notified that the script is sent to pharmacy. Patient states that she is not breast feeding.

## 2018-06-30 NOTE — Telephone Encounter (Signed)
Message sent to provider 

## 2018-07-19 ENCOUNTER — Telehealth: Payer: Self-pay

## 2018-07-20 ENCOUNTER — Telehealth: Payer: Self-pay | Admitting: Family Medicine

## 2018-07-20 ENCOUNTER — Other Ambulatory Visit: Payer: Self-pay | Admitting: Family Medicine

## 2018-07-20 NOTE — Telephone Encounter (Signed)
Several attempt to contact patient and unable to leave voice message. We are unable to refill Tramadol. Patient has Rxs for Zanaflex and Naproxen and will need to call in these refills. We will refer patient to Physical Therapy or Pain Management if pain is not resolved. Please inform patient.

## 2018-07-20 NOTE — Telephone Encounter (Signed)
Patient would like to get another script for Tramadol for her back pain.

## 2018-07-20 NOTE — Telephone Encounter (Signed)
Left a vm for patient to callback 

## 2018-07-21 NOTE — Telephone Encounter (Signed)
Patient would like to be referred to pain management. 

## 2018-07-22 ENCOUNTER — Other Ambulatory Visit: Payer: Self-pay | Admitting: Family Medicine

## 2018-07-22 DIAGNOSIS — G8929 Other chronic pain: Secondary | ICD-10-CM

## 2018-07-25 NOTE — Telephone Encounter (Signed)
Left a vm for patient to callback regarding referral

## 2018-07-26 NOTE — Telephone Encounter (Signed)
Patient notified that referral has been faxed to [facility]

## 2018-08-01 ENCOUNTER — Other Ambulatory Visit: Payer: Self-pay

## 2018-08-01 ENCOUNTER — Encounter (HOSPITAL_COMMUNITY): Payer: Self-pay

## 2018-08-01 ENCOUNTER — Ambulatory Visit (HOSPITAL_COMMUNITY)
Admission: EM | Admit: 2018-08-01 | Discharge: 2018-08-01 | Disposition: A | Discharge status: Home / Self Care | Payer: Medicaid Other | Attending: Family Medicine | Admitting: Family Medicine

## 2018-08-01 DIAGNOSIS — J4520 Mild intermittent asthma, uncomplicated: Secondary | ICD-10-CM | POA: Insufficient documentation

## 2018-08-01 DIAGNOSIS — R509 Fever, unspecified: Secondary | ICD-10-CM | POA: Diagnosis present

## 2018-08-01 DIAGNOSIS — J029 Acute pharyngitis, unspecified: Secondary | ICD-10-CM | POA: Insufficient documentation

## 2018-08-01 DIAGNOSIS — R0602 Shortness of breath: Secondary | ICD-10-CM | POA: Diagnosis present

## 2018-08-01 LAB — POCT RAPID STREP A: Streptococcus, Group A Screen (Direct): NEGATIVE

## 2018-08-01 MED ORDER — AMOXICILLIN 875 MG PO TABS: 875.0000 mg | ORAL_TABLET | Freq: Two times a day (BID) | ORAL | 0 refills | Status: AC

## 2018-08-01 MED ORDER — ALBUTEROL SULFATE HFA 108 (90 BASE) MCG/ACT IN AERS: 1.0000 | INHALATION_SPRAY | Freq: Four times a day (QID) | RESPIRATORY_TRACT | 1 refills | Status: AC | PRN

## 2018-08-01 NOTE — ED Triage Notes (Signed)
Patient presents to Urgent Care with complaints of fever and sore throat since yesterday. Patient reports highest recorded fever at home was 100.7.

## 2018-08-01 NOTE — ED Notes (Signed)
Pt states she had negative COVID-19 test performed last week.

## 2018-08-03 LAB — CULTURE, GROUP A STREP (THRC)

## 2018-08-03 NOTE — ED Provider Notes (Signed)
[facility]   [id number] 08/01/18 Arrival Time: 1030  ASSESSMENT & PLAN:  1. Sore throat   2. Fever and chills   3. Mild intermittent asthma, unspecified whether complicated    No signs of peritonsillar abscess. Discussed.  Meds ordered this encounter  Medications  . albuterol (VENTOLIN HFA) 108 (90 Base) MCG/ACT inhaler    Sig: Inhale 1-2 puffs into the lungs every 6 (six) hours as needed for wheezing or shortness of breath.    Dispense:  1 Inhaler    Refill:  1  . amoxicillin (AMOXIL) 875 MG tablet    Sig: Take 1 tablet (875 mg total) by mouth 2 (two) times daily for 10 days.    Dispense:  20 tablet    Refill:  0   Refilled inhaler at request.  Rapid strep negative. Culture sent.  OTC analgesics and throat care as needed  Instructed to finish full 10 day course of antibiotics. Will follow up if not showing significant improvement over the next 24-48 hours.  Reviewed expectations re: course of current medical issues. Questions answered. Outlined signs and symptoms indicating need for more acute intervention. Patient verbalized understanding. After Visit Summary given.   SUBJECTIVE:  Alexandria Beltran is a 28 y.o. female who reports a sore throat. Describes as "extreme pain". Onset abrupt beginning 1 day ago. No respiratory symptoms. Normal PO intake but reports discomfort with swallowing. Fever reported: yes with chills. No neck pain or swelling. No associated n/v/abdominal symptoms. Sick contacts: none known.  OTC treatment: Tylenol without much relief.  Also requests refill of albuterol inhaler. Out. Sporadic exacerbations this time of year. No current wheezing or SOB.  ROS: As per HPI. All other systems negative.    OBJECTIVE:  Vitals:   08/01/18 1108  BP: (!) 125/93  Pulse: (!) 105  Resp: 18  Temp: (!) 102.1 F (38.9 C)  TempSrc: Oral  SpO2: 100%    Abnormal vitals noted.  General appearance: alert; no distress HEENT: throat with tonsillar  hypertrophy, moderate erythema and exudates present; uvula midline: yes Neck: supple with FROM; small bilateral LAD with tenderness CV: tachycardic; regular Lungs: clear to auscultation bilaterally Abd: soft; non-tender Skin: reveals no rash; warm and dry Neuro: normal gait Psychological: alert and cooperative; normal mood and affect  Allergies  Allergen Reactions  . Bee Pollen   . Pollen Extract     Past Medical History:  Diagnosis Date  . Anemia   . Anxiety   . Asthma    Inhaler used 07/22/16  . Chronic back pain   . Gestational diabetes   . Scoliosis    Social History   Socioeconomic History  . Marital status: Single    Spouse name: Not on file  . Number of children: Not on file  . Years of education: Not on file  . Highest education level: Not on file  Occupational History  . Not on file  Social Needs  . Financial resource strain: Not on file  . Food insecurity:    Worry: Not on file    Inability: Not on file  . Transportation needs:    Medical: Not on file    Non-medical: Not on file  Tobacco Use  . Smoking status: Former Smoker    Packs/day: 0.25    Years: 6.00    Pack years: 1.50    Types: Cigarettes  . Smokeless tobacco: Never Used  Substance and Sexual Activity  . Alcohol use: No    Frequency: Never  Comment: occasionally (not with pregnancy)  . Drug use: No  . Sexual activity: Yes    Birth control/protection: None    Comment: undecided between two  Lifestyle  . Physical activity:    Days per week: Not on file    Minutes per session: Not on file  . Stress: Not on file  Relationships  . Social connections:    Talks on phone: Not on file    Gets together: Not on file    Attends religious service: Not on file    Active member of club or organization: Not on file    Attends meetings of clubs or organizations: Not on file    Relationship status: Not on file  . Intimate partner violence:    Fear of current or ex partner: Not on file     Emotionally abused: Not on file    Physically abused: Not on file    Forced sexual activity: Not on file  Other Topics Concern  . Not on file  Social History Narrative  . Not on file   Family History  Problem Relation Age of Onset  . Cancer Mother   . Diabetes Mother   . Alcohol abuse Mother   . Alcohol abuse Maternal Aunt   . Cancer Maternal Uncle   . Alcohol abuse Maternal Guido SanderUncle           Sahej Schrieber, MD 08/03/18 [phone]

## 2018-10-15 ENCOUNTER — Other Ambulatory Visit: Payer: Self-pay | Admitting: Family Medicine

## 2018-10-15 DIAGNOSIS — L309 Dermatitis, unspecified: Secondary | ICD-10-CM

## 2018-12-08 ENCOUNTER — Ambulatory Visit: Payer: Self-pay | Admitting: Family Medicine

## 2018-12-13 ENCOUNTER — Encounter: Payer: Self-pay | Admitting: Family Medicine

## 2018-12-13 ENCOUNTER — Ambulatory Visit: Payer: Self-pay | Admitting: Family Medicine

## 2019-01-15 ENCOUNTER — Encounter (HOSPITAL_COMMUNITY): Payer: Self-pay | Admitting: Emergency Medicine

## 2019-01-15 ENCOUNTER — Emergency Department (HOSPITAL_COMMUNITY)
Admission: EM | Admit: 2019-01-15 | Discharge: 2019-01-15 | Disposition: A | Discharge status: Home / Self Care | Payer: Medicaid Other | Attending: Emergency Medicine | Admitting: Emergency Medicine

## 2019-01-15 ENCOUNTER — Other Ambulatory Visit: Payer: Self-pay

## 2019-01-15 DIAGNOSIS — Z8709 Personal history of other diseases of the respiratory system: Secondary | ICD-10-CM | POA: Diagnosis not present

## 2019-01-15 DIAGNOSIS — I10 Essential (primary) hypertension: Secondary | ICD-10-CM | POA: Insufficient documentation

## 2019-01-15 DIAGNOSIS — Z87891 Personal history of nicotine dependence: Secondary | ICD-10-CM | POA: Diagnosis not present

## 2019-01-15 DIAGNOSIS — U071 COVID-19: Secondary | ICD-10-CM | POA: Insufficient documentation

## 2019-01-15 DIAGNOSIS — R519 Headache, unspecified: Secondary | ICD-10-CM | POA: Diagnosis not present

## 2019-01-15 DIAGNOSIS — R430 Anosmia: Secondary | ICD-10-CM

## 2019-01-15 DIAGNOSIS — R438 Other disturbances of smell and taste: Secondary | ICD-10-CM | POA: Diagnosis present

## 2019-01-15 DIAGNOSIS — R432 Parageusia: Secondary | ICD-10-CM

## 2019-01-15 LAB — POC SARS CORONAVIRUS 2 AG -  ED: SARS Coronavirus 2 Ag: POSITIVE — AB

## 2019-01-15 NOTE — ED Notes (Signed)
POC CoV-2 Covid test Positive, called to [facility].

## 2019-01-15 NOTE — ED Triage Notes (Signed)
C/o loss of taste and smell.  Requesting COVID testing.  States she had a fever a few days ago that resolved.

## 2019-01-15 NOTE — Discharge Instructions (Addendum)
Your covid test returned positive today.   Please stay home and self isolate for the next 14 days (cleared: 01/30/2019).   Monitor your symptoms at home and take Tylenol as needed for fever. You can take OTC medications for cough as well if this develops.   Please follow up with your PCP regarding your covid 19 positive status.      Person Under Monitoring Name: Alexandria Beltran  Location: [address] [place] [address]   Infection Prevention Recommendations for Individuals Confirmed to have, or Being Evaluated for, 2019 Novel Coronavirus (COVID-19) Infection Who Receive Care at Home  Individuals who are confirmed to have, or are being evaluated for, COVID-19 should follow the prevention steps below until a healthcare provider or local or state health department says they can return to normal activities.  Stay home except to get medical care You should restrict activities outside your home, except for getting medical care. Do not go to work, school, or public areas, and do not use public transportation or taxis.  Call ahead before visiting your doctor Before your medical appointment, call the healthcare provider and tell them that you have, or are being evaluated for, COVID-19 infection. This will help the healthcare providers office take steps to keep other people from getting infected. Ask your healthcare provider to call the local or state health department.  Monitor your symptoms Seek prompt medical attention if your illness is worsening (e.g., difficulty breathing). Before going to your medical appointment, call the healthcare provider and tell them that you have, or are being evaluated for, COVID-19 infection. Ask your healthcare provider to call the local or state health department.  Wear a facemask You should wear a facemask that covers your nose and mouth when you are in the same room with other people and when you visit a healthcare provider. People who live  with or visit you should also wear a facemask while they are in the same room with you.  Separate yourself from other people in your home As much as possible, you should stay in a different room from other people in your home. Also, you should use a separate bathroom, if available.  Avoid sharing household items You should not share dishes, drinking glasses, cups, eating utensils, towels, bedding, or other items with other people in your home. After using these items, you should wash them thoroughly with soap and water.  Cover your coughs and sneezes Cover your mouth and nose with a tissue when you cough or sneeze, or you can cough or sneeze into your sleeve. Throw used tissues in a lined trash can, and immediately wash your hands with soap and water for at least 20 seconds or use an alcohol-based hand rub.  Wash your [facility] your hands often and thoroughly with soap and water for at least 20 seconds. You can use an alcohol-based hand sanitizer if soap and water are not available and if your hands are not visibly dirty. Avoid touching your eyes, nose, and mouth with unwashed hands.   Prevention Steps for Caregivers and Household Members of Individuals Confirmed to have, or Being Evaluated for, COVID-19 Infection Being Cared for in the Home  If you live with, or provide care at home for, a person confirmed to have, or being evaluated for, COVID-19 infection please follow these guidelines to prevent infection:  Follow healthcare providers instructions Make sure that you understand and can help the patient follow any healthcare provider instructions for all care.  Provide for the patients basic needs You should help the patient with basic needs in the home and provide support for getting groceries, prescriptions, and other personal needs.  Monitor the patients symptoms If they are getting sicker, call his or her medical provider and tell them that the patient has, or is being  evaluated for, COVID-19 infection. This will help the healthcare providers office take steps to keep other people from getting infected. Ask the healthcare provider to call the local or state health department.  Limit the number of people who have contact with the patient If possible, have only one caregiver for the patient. Other household members should stay in another home or place of residence. If this is not possible, they should stay in another room, or be separated from the patient as much as possible. Use a separate bathroom, if available. Restrict visitors who do not have an essential need to be in the home.  Keep older adults, very young children, and other sick people away from the patient Keep older adults, very young children, and those who have compromised immune systems or chronic health conditions away from the patient. This includes people with chronic heart, lung, or kidney conditions, diabetes, and cancer.  Ensure good ventilation Make sure that shared spaces in the home have good air flow, such as from an air conditioner or an opened window, weather permitting.  Wash your hands often Wash your hands often and thoroughly with soap and water for at least 20 seconds. You can use an alcohol based hand sanitizer if soap and water are not available and if your hands are not visibly dirty. Avoid touching your eyes, nose, and mouth with unwashed hands. Use disposable paper towels to dry your hands. If not available, use dedicated cloth towels and replace them when they become wet.  Wear a facemask and gloves Wear a disposable facemask at all times in the room and gloves when you touch or have contact with the patients blood, body fluids, and/or secretions or excretions, such as sweat, saliva, sputum, nasal mucus, vomit, urine, or feces.  Ensure the mask fits over your nose and mouth tightly, and do not touch it during use. Throw out disposable facemasks and gloves after using  them. Do not reuse. Wash your hands immediately after removing your facemask and gloves. If your personal clothing becomes contaminated, carefully remove clothing and launder. Wash your hands after handling contaminated clothing. Place all used disposable facemasks, gloves, and other waste in a lined container before disposing them with other household waste. Remove gloves and wash your hands immediately after handling these items.  Do not share dishes, glasses, or other household items with the patient Avoid sharing household items. You should not share dishes, drinking glasses, cups, eating utensils, towels, bedding, or other items with a patient who is confirmed to have, or being evaluated for, COVID-19 infection. After the person uses these items, you should wash them thoroughly with soap and water.  Wash laundry thoroughly Immediately remove and wash clothes or bedding that have blood, body fluids, and/or secretions or excretions, such as sweat, saliva, sputum, nasal mucus, vomit, urine, or feces, on them. Wear gloves when handling laundry from the patient. Read and follow directions on labels of laundry or clothing items and detergent. In general, wash and dry with the warmest temperatures recommended on the label.  Clean all areas the individual has used often Clean all touchable surfaces, such as counters, tabletops, doorknobs, bathroom fixtures, toilets, phones, keyboards, tablets,  and bedside tables, every day. Also, clean any surfaces that may have blood, body fluids, and/or secretions or excretions on them. Wear gloves when cleaning surfaces the patient has come in contact with. Use a diluted bleach solution (e.g., dilute bleach with 1 part bleach and 10 parts water) or a household disinfectant with a label that says EPA-registered for coronaviruses. To make a bleach solution at home, add 1 tablespoon of bleach to 1 quart (4 cups) of water. For a larger supply, add  cup of bleach to 1  gallon (16 cups) of water. Read labels of cleaning products and follow recommendations provided on product labels. Labels contain instructions for safe and effective use of the cleaning product including precautions you should take when applying the product, such as wearing gloves or eye protection and making sure you have good ventilation during use of the product. Remove gloves and wash hands immediately after cleaning.  Monitor yourself for signs and symptoms of illness Caregivers and household members are considered close contacts, should monitor their health, and will be asked to limit movement outside of the home to the extent possible. Follow the monitoring steps for close contacts listed on the symptom monitoring form.   ? If you have additional questions, contact your local health department or call the epidemiologist on call at [phone] (available 24/7). ? This guidance is subject to change. For the most up-to-date guidance from [facility], please refer to their website: [email or web address]hu

## 2019-01-15 NOTE — ED Provider Notes (Signed)
[facility] EMERGENCY DEPARTMENT Provider Note   CSN: [id number] Arrival date & time: 01/15/19  1540     History   Chief Complaint Chief Complaint  Patient presents with   Cov+/ loss of taste    HPI Alexandria Beltran is a 28 y.o. female who presents to the ED today complaining of gradual onset, constant, loss of taste and smell that began today.  She reports that about a week ago she had fevers with T-max 104.0.  She states that the fevers only lasted 1 day and then resolved after taking Tylenol naproxen.  Patient states that this morning she began having a slight headache as well as loss of taste and smell and is concerned that she could have COVID-19.  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  Patient does state that she gets tested every Tuesday and she was tested last Tuesday but states it was negative.  Patient has no other complaints besides loss of taste and smell today.  Denies recent fevers, chills, cough, shortness of breath, abdominal pain, nausea, vomiting, diarrhea, any other associated symptoms.        Past Medical History:  Diagnosis Date   Anemia    Anxiety    Asthma    Inhaler used 07/22/16   Chronic back pain    Gestational diabetes    Hypocalcemia    Hyponatremia    Scoliosis     Patient Active Problem List   Diagnosis Date Noted   Hypertension 06/09/2018   Back pain 06/09/2018   Anemia 06/26/2017   Gestational diabetes 06/25/2017   NSVD (normal spontaneous vaginal delivery) 06/25/2017   Supervision of high risk pregnancy, antepartum 04/05/2017   Asthma affecting pregnancy in third trimester 04/05/2017   GDM, class A2 04/05/2017    Past Surgical History:  Procedure Laterality Date   NO PAST SURGERIES       OB History    Gravida  2   Para  2   Term  2   Preterm      AB      Living  2     SAB      TAB      Ectopic      Multiple  0   Live Births  2            Home Medications    Prior to Admission medications   Medication Sig Start Date End Date Taking? Authorizing Provider  albuterol (PROVENTIL) (2.5 MG/3ML) 0.083% nebulizer solution Take 3 mLs (2.5 mg total) by nebulization every 6 (six) hours as needed for wheezing or shortness of breath. Patient not taking: Reported on 06/08/2018 12/24/17   Azzie Glatter, FNP  albuterol (VENTOLIN HFA) 108 (90 Base) MCG/ACT inhaler Inhale 1-2 puffs into the lungs every 6 (six) hours as needed for wheezing or shortness of breath. 08/01/18   Vanessa Kick, MD  naproxen (NAPROSYN) 500 MG tablet Take 1 tablet (500 mg total) by mouth 2 (two) times daily with a meal. 06/08/18   Azzie Glatter, FNP  tiZANidine (ZANAFLEX) 4 MG capsule Take 1 capsule (4 mg total) by mouth 3 (three) times daily as needed for muscle spasms. 06/14/18   Azzie Glatter, FNP  triamcinolone ointment (KENALOG) 0.5 % APPLY TOPICALLY TWICE DAILY 10/17/18   Azzie Glatter, FNP    Family History Family History  Problem Relation Age of Onset  Cancer Mother    Diabetes Mother    Alcohol abuse Mother    Alcohol abuse Maternal Aunt    Cancer Maternal Uncle    Alcohol abuse Maternal Uncle     Social History Social History   Tobacco Use   Smoking status: Former Smoker    Packs/day: 0.25    Years: 6.00    Pack years: 1.50    Types: Cigarettes   Smokeless tobacco: Never Used  Substance Use Topics   Alcohol use: No    Frequency: Never    Comment: occasionally (not with pregnancy)   Drug use: No     Allergies   Bee pollen and Pollen extract   Review of Systems Review of Systems  Constitutional: Positive for fever (resolved). Negative for chills.  HENT: Negative for congestion.        + loss of taste and smell  Respiratory: Negative for cough and shortness of breath.   Gastrointestinal: Negative for diarrhea, nausea and vomiting.  Neurological: Positive for  headaches.     Physical Exam Updated Vital Signs BP 128/90    Pulse 84    Temp 98.3 F (36.8 C) (Oral)    Resp 16    SpO2 97%   Physical Exam Vitals signs and nursing note reviewed.  Constitutional:      Appearance: She is not ill-appearing or diaphoretic.  HENT:     Head: Normocephalic and atraumatic.  Eyes:     Conjunctiva/sclera: Conjunctivae normal.  Cardiovascular:     Rate and Rhythm: Normal rate and regular rhythm.     Pulses: Normal pulses.  Pulmonary:     Effort: Pulmonary effort is normal.     Breath sounds: Normal breath sounds. No wheezing, rhonchi or rales.  Skin:    General: Skin is warm and dry.     Coloration: Skin is not jaundiced.  Neurological:     Mental Status: She is alert.      ED Treatments / Results  Labs (all labs ordered are listed, but only abnormal results are displayed) Labs Reviewed  POC SARS CORONAVIRUS 2 AG -  ED - Abnormal; Notable for the following components:      Result Value   SARS Coronavirus 2 Ag POSITIVE (*)    All other components within normal limits    EKG None  Radiology No results found.  Procedures Procedures (including critical care time)  Medications Ordered in ED Medications - No data to display   Initial Impression / Assessment and Plan / ED Course  I have reviewed the triage vital signs and the nursing notes.  Pertinent labs & imaging results that were available during my care of the patient were reviewed by me and considered in my medical decision making (see chart for details).    28 year old female presents the ED today complaining of loss of taste and smell that started today.  She had fevers 1 week ago that have since resolved.  Patient states she feels fine besides her current symptoms.  She appears nontoxic on exam.  Her vitals are stable today.  She is afebrile without tachycardia or tachypnea.  She is satting 97% on room air and does not complain of any shortness of breath.  Patient is able to  speak in full sentences without accessory muscle use and lungs are clear to auscultation bilaterally.  Will test for Covid at this time with rapid test.  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   Covid test positive.  Will send patient home at this time with instructions to self isolate for the next 14 days.  A lengthy discussion with patient regarding this.  Will give work note.  Patient does live in a 1 bedroom with her 2 children as well as her boyfriend.  Have advised that if her boyfriend wants to get tested he can go through drive-through testing tomorrow.  Patient advised to keep children out of daycare for the next 2 weeks as well.  Patient advised to follow-up with her PCP.  Is in agreement with plan at this time and stable for discharge home.  This note was prepared using Dragon voice recognition software and may include unintentional dictation errors due to the inherent limitations of voice recognition software.  Alexandria Beltran was evaluated in Emergency Department on 01/15/2019 for the symptoms described in the history of present illness. She was evaluated in the context of the global COVID-19 pandemic, which necessitated consideration that the patient might be at risk for infection with the SARS-CoV-2 virus that causes COVID-19. Institutional protocols and algorithms that pertain to the evaluation of patients at risk for COVID-19 are in a state of rapid change based on information released by regulatory bodies including the CDC and federal and state organizations. These policies and algorithms were followed during the patient's care in the ED.       Final Clinical Impressions(s) / ED Diagnoses   Final diagnoses:  COVID-19  Loss of taste  Loss of smell    ED Discharge Orders    None       Tanda RockersVenter, Elianny Buxbaum, PA-C 01/15/19 1900    Blane OharaZavitz, Joshua, MD 01/15/19 2345

## 2019-03-02 ENCOUNTER — Other Ambulatory Visit: Payer: Self-pay | Admitting: Family Medicine

## 2019-03-02 DIAGNOSIS — R0602 Shortness of breath: Secondary | ICD-10-CM

## 2019-06-16 IMAGING — US US FETAL BPP W/ NON-STRESS
1 series · 13 of 14 positions shown · non-contrast
Comparison: none

[Series 1: us fetal bpp w/nonstress · 14 acquisitions, 13 frames shown]
[im 1/14]
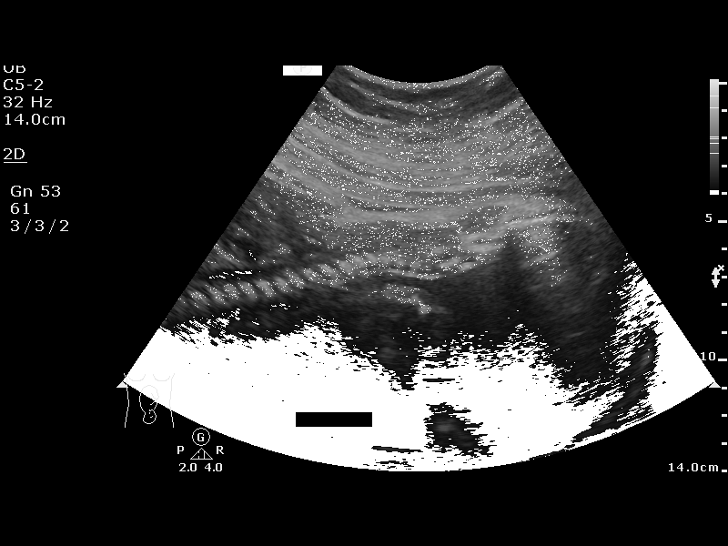
[im 2/14]
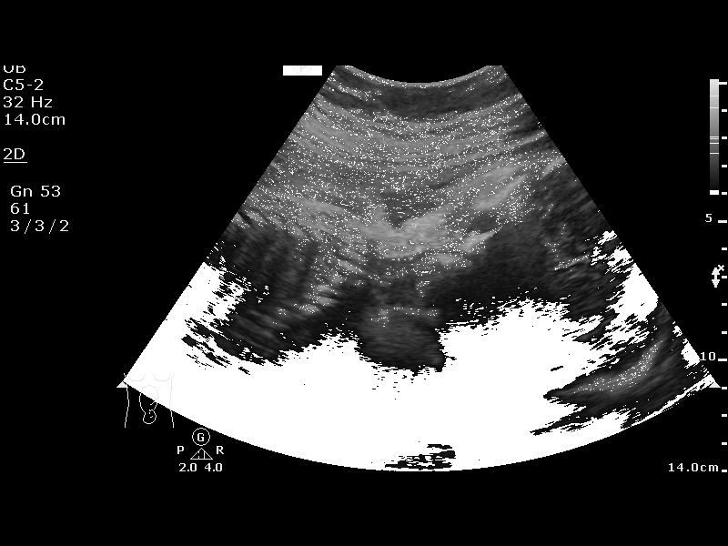
[im 3/14]
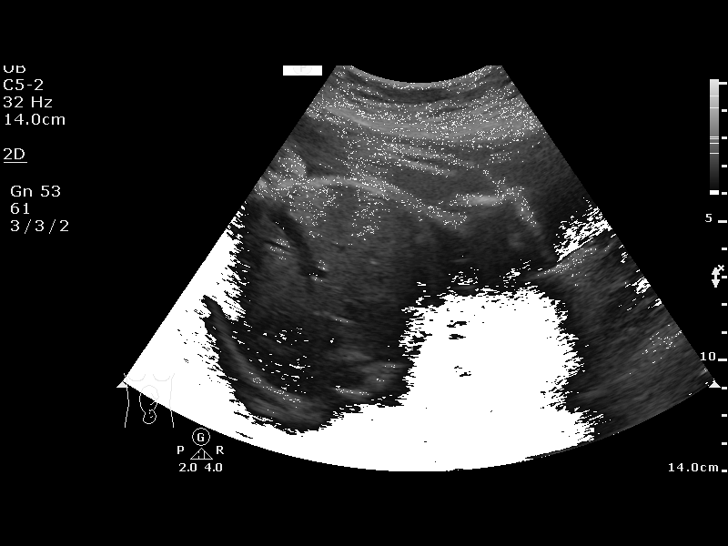
[im 4/14]
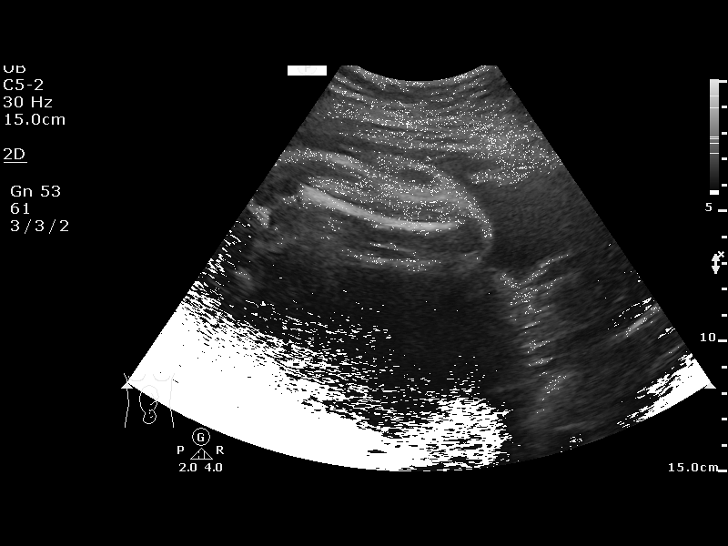
[im 5/14]
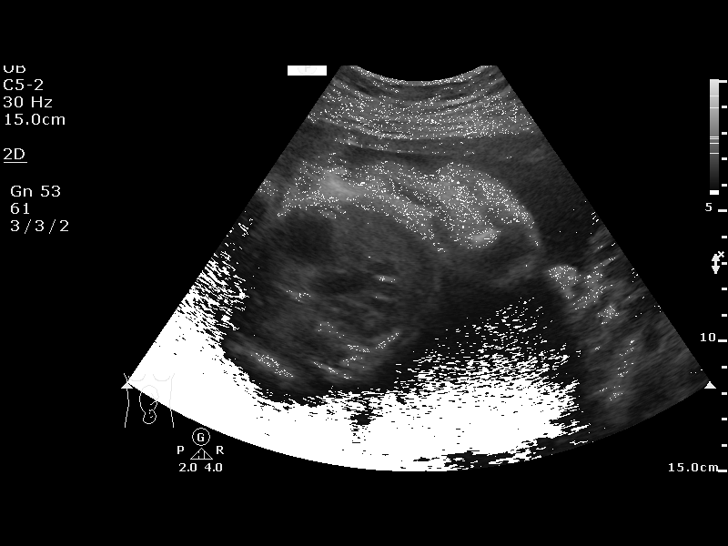
[im 6/14]
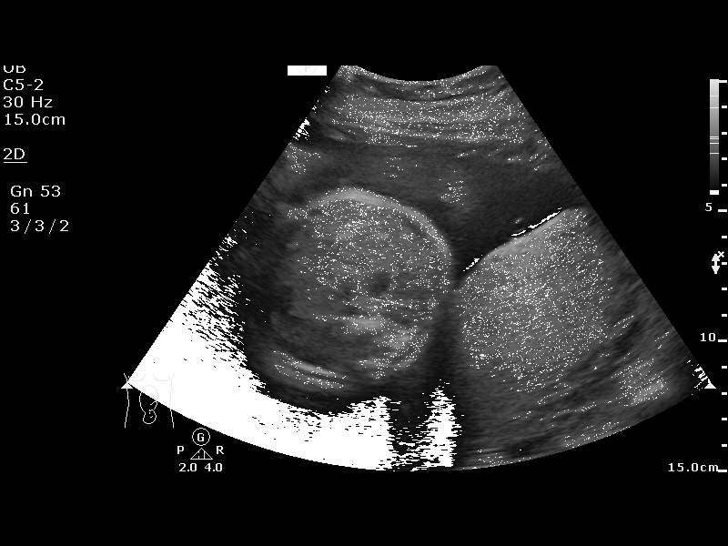
[im 8/14]
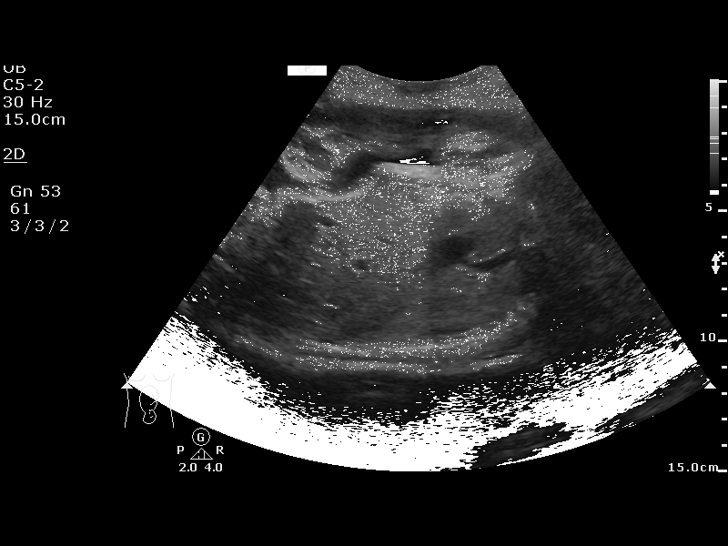
[im 9/14]
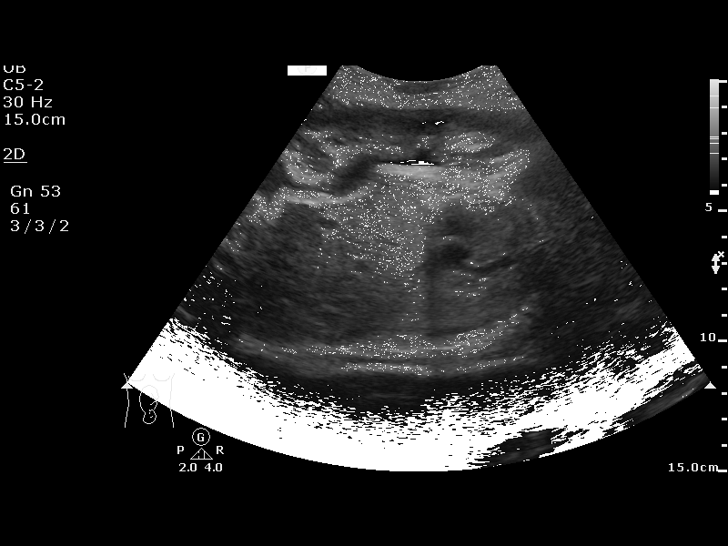
[im 10/14]
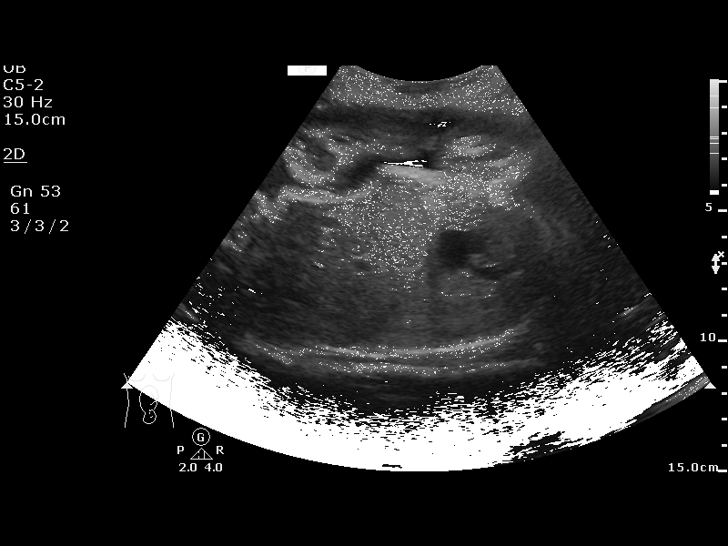
[im 11/14]
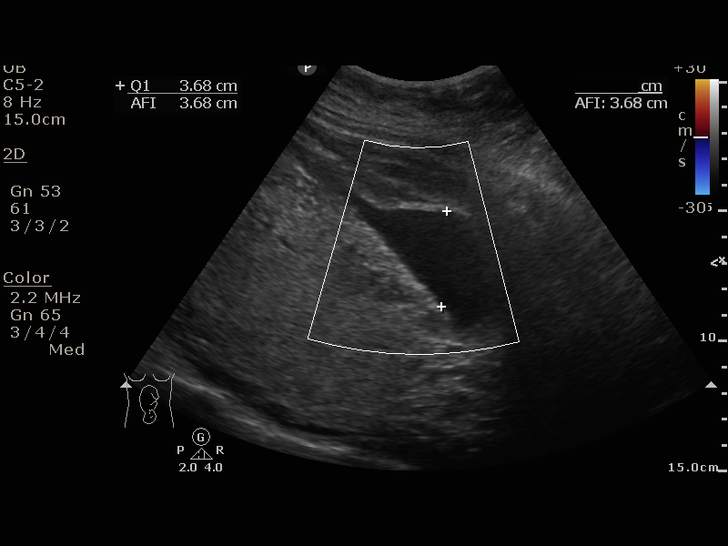
[im 12/14]
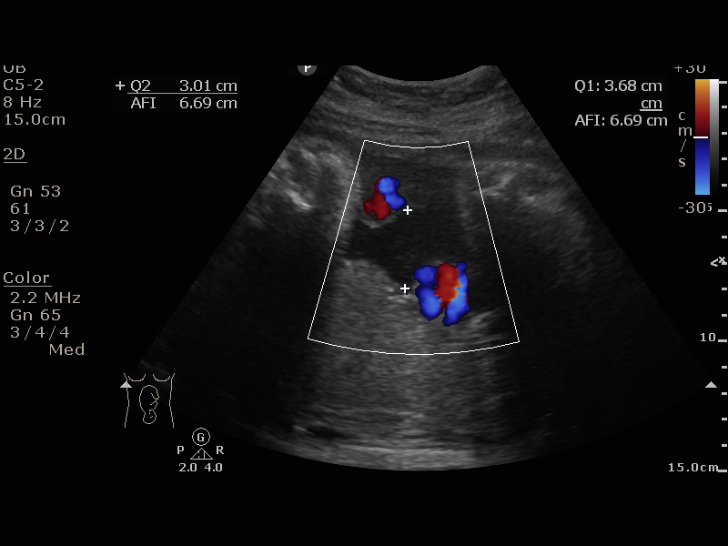
[im 13/14]
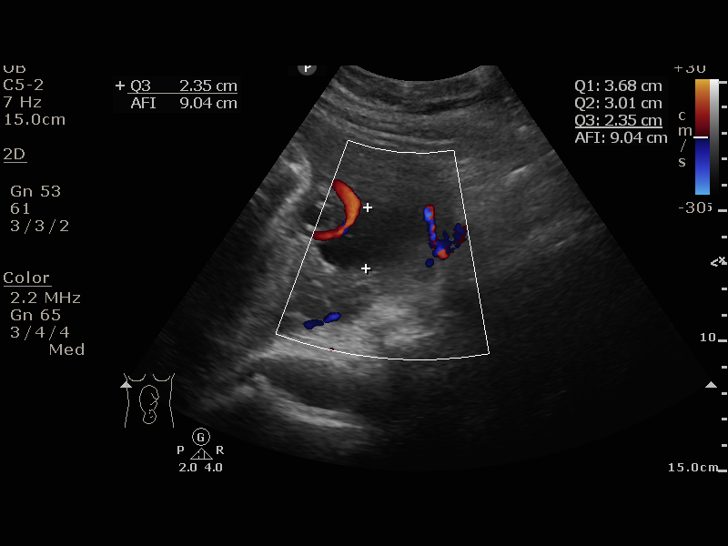
[im 14/14]
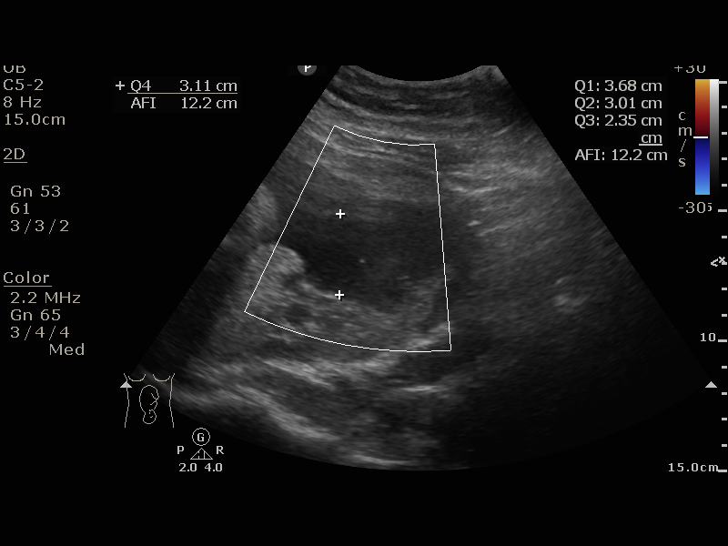

[13 of 14 positions shown; findings below may reference images not displayed]

OB/Gyn Clinic
Attending:        Marijan Jovanka Zekovic         [facility]         [facility]
[REDACTED]

1  US FETAL BPP W/NONSTRESS                    76818.4

1  MEGHMIK SAVADI           [id number]      [id number]     [id number]
Service(s) Provided

Indications

32 weeks gestation of pregnancy
Gestational diabetes in pregnancy,
controlled by oral hypoglycemic drugs
Fetal Evaluation

Num Of Fetuses:     1
Preg. Location:     Intrauterine
Cardiac Activity:   Observed
Presentation:       Cephalic

Amniotic Fluid
AFI FV:      Subjectively within normal limits

AFI Sum(cm)     %Tile       Largest Pocket(cm)
12.15           33

RUQ(cm)       RLQ(cm)       LUQ(cm)        LLQ(cm)
3.68
Biophysical Evaluation
Amniotic F.V:   Pocket => 2 cm two         F. Tone:        Observed
planes
F. Movement:    Observed                   N.S.T:          Reactive
F. Breathing:   Observed                   Score:          [DATE]
Gestational Age

LMP:           32w 4d        Date:  09/25/16                 EDD:   07/02/17
Best:          32w 4d     Det. By:  LMP  (09/25/16)          EDD:   07/02/17
Impression

IUP at  36w2d
Normal amniotic fluid volume
Recommendations

Continue recommended antenatal testing

## 2019-07-10 ENCOUNTER — Other Ambulatory Visit: Payer: Self-pay | Admitting: Family Medicine

## 2019-07-10 DIAGNOSIS — L309 Dermatitis, unspecified: Secondary | ICD-10-CM

## 2019-07-10 NOTE — Telephone Encounter (Signed)
Requesting refill Triamcinolone 0.5%

## 2019-07-13 IMAGING — US US FETAL BPP W/ NON-STRESS
1 series · 11 of 11 positions shown · non-contrast
Comparison: none

[Series 1: us fetal bpp w/nonstress · 11 acquisitions, 11 frames shown]
[im 1/11]
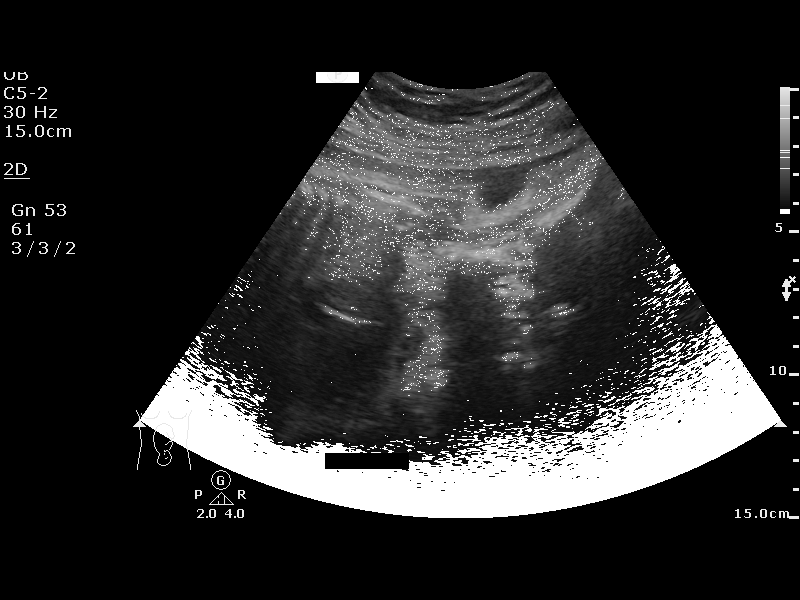
[im 2/11]
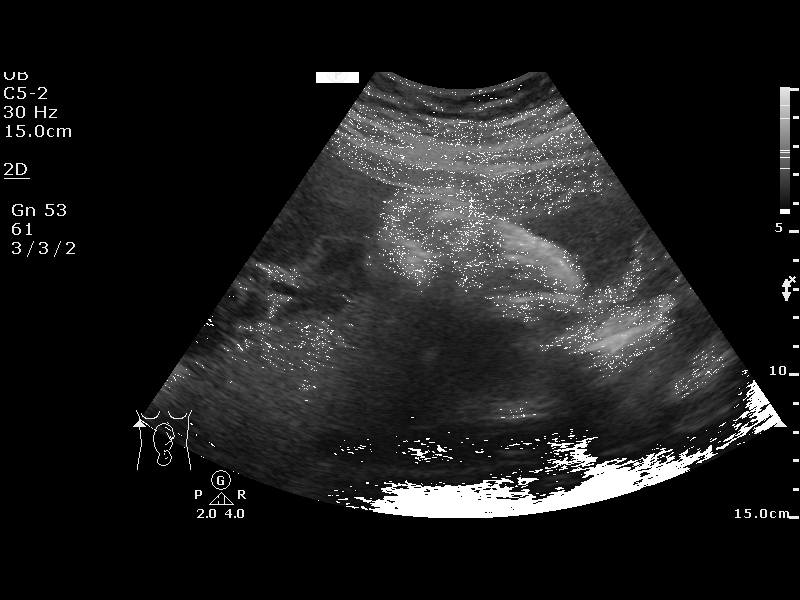
[im 3/11]
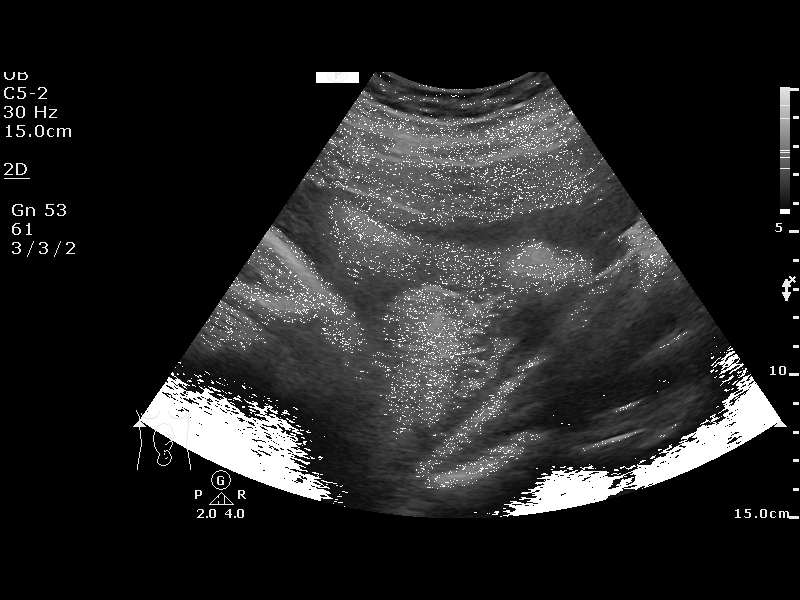
[im 4/11]
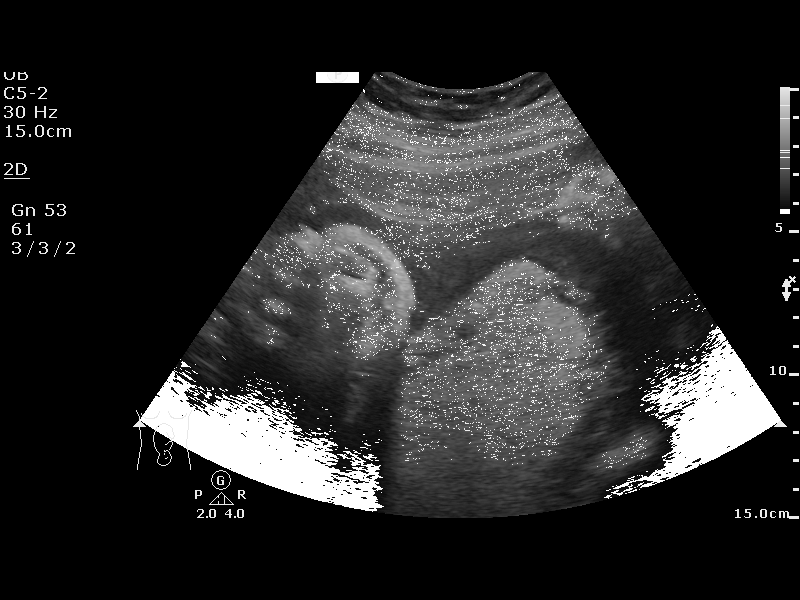
[im 5/11]
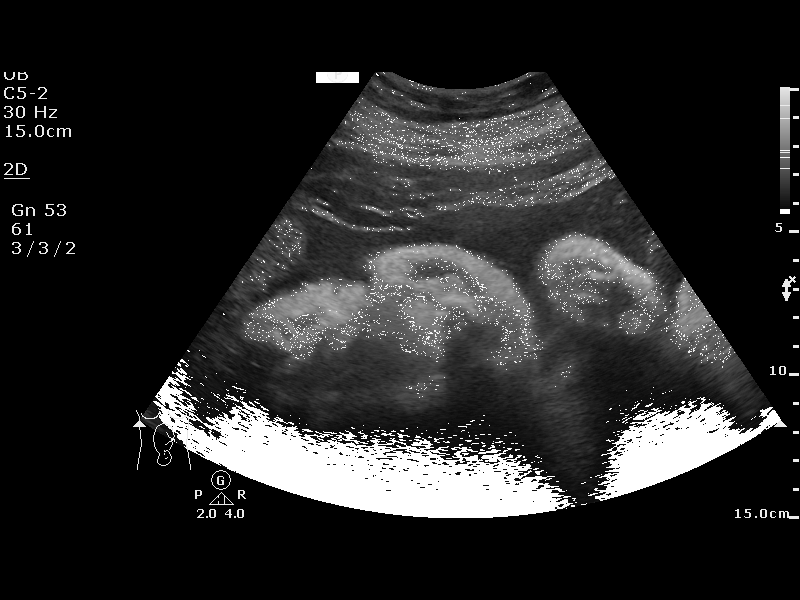
[im 6/11]
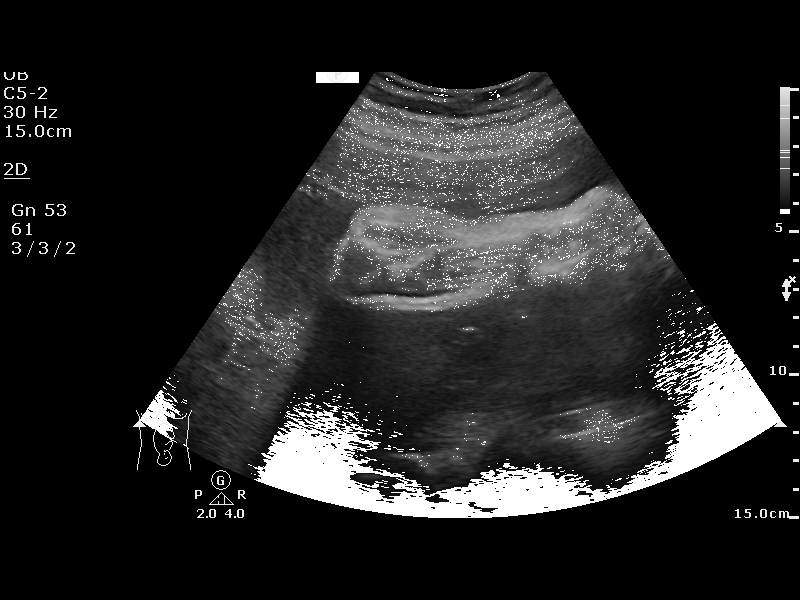
[im 7/11]
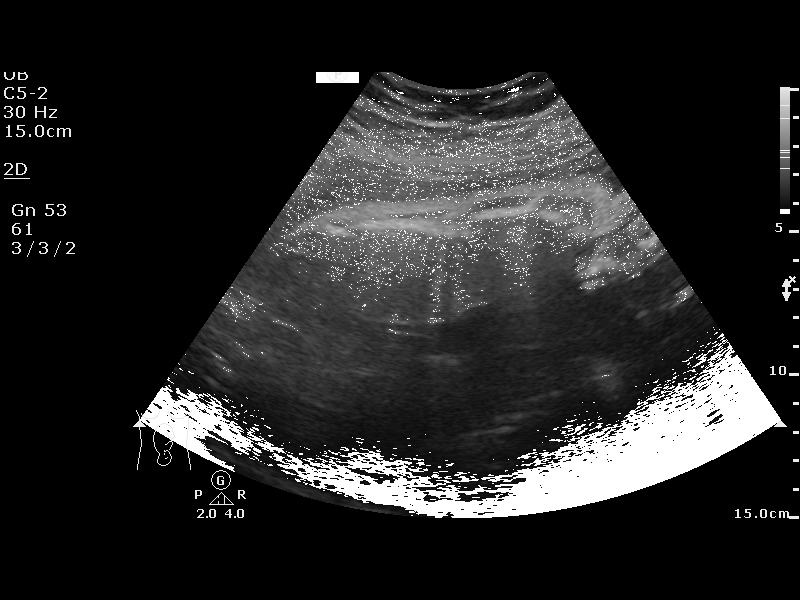
[im 8/11]
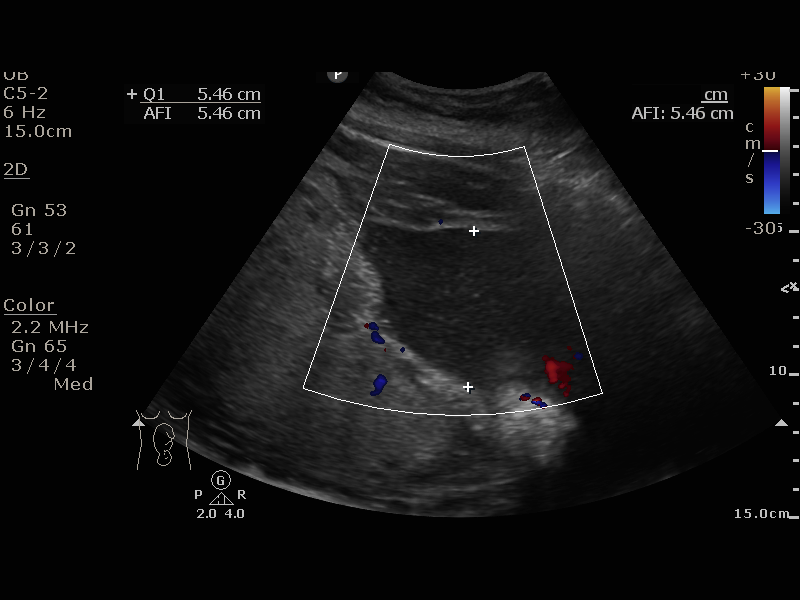
[im 9/11]
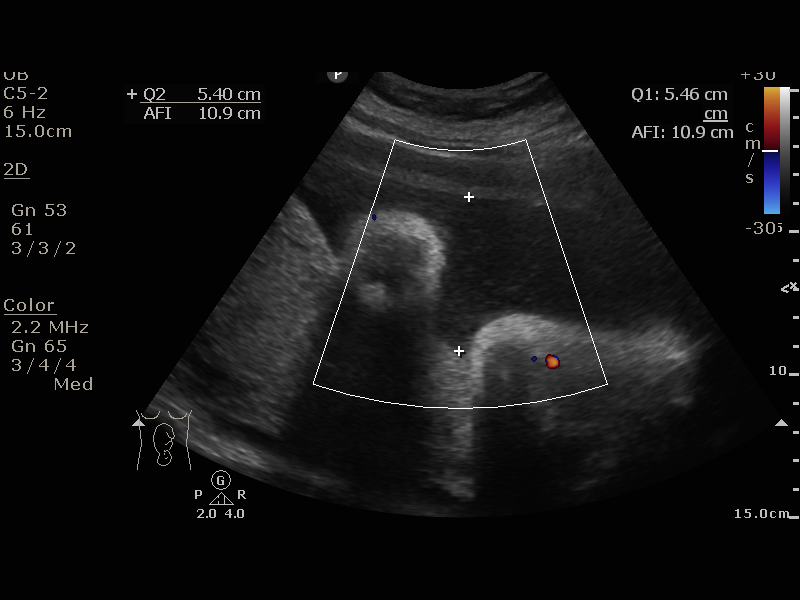
[im 10/11]
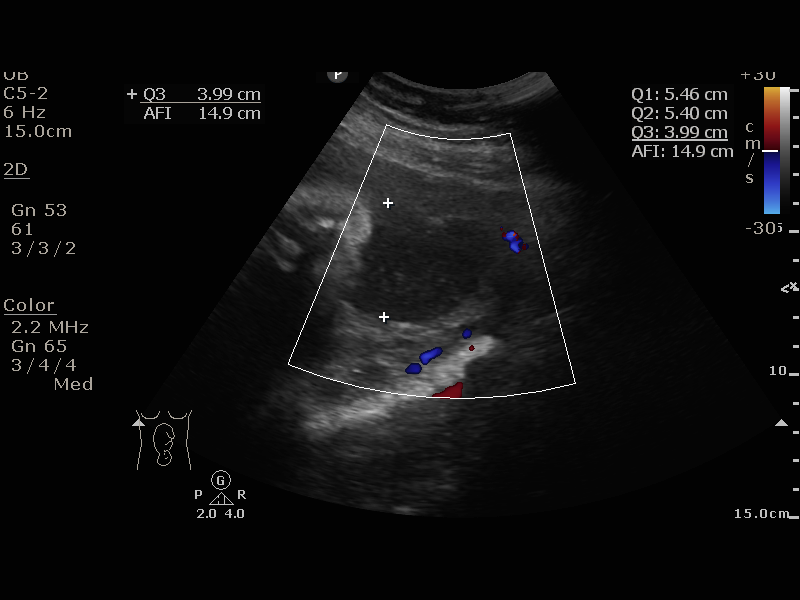
[im 11/11]
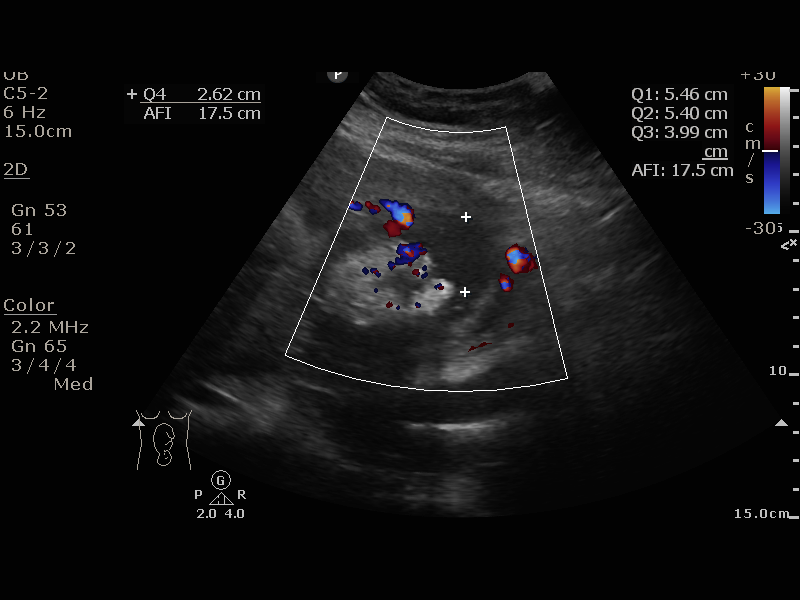

[11 of 11 positions shown; findings below may reference images not displayed]

[facility]yn Clinic
[facility]
[REDACTED]

1  US FETAL BPP W/NONSTRESS                    76818.4

1  SENSEILI DATIZ             [id number]      [id number]     [id number]
Service(s) Provided

Indications

36 weeks gestation of pregnancy
Gestational diabetes in pregnancy,
controlled by oral hypoglycemic drugs
Fetal Evaluation

Num Of Fetuses:     1
Preg. Location:     Intrauterine
Cardiac Activity:   Observed
Presentation:       Cephalic

Amniotic Fluid
AFI FV:      Subjectively within normal limits

AFI Sum(cm)     %Tile       Largest Pocket(cm)
17.47           66

RUQ(cm)       RLQ(cm)       LUQ(cm)        LLQ(cm)
5.46

Comment:    Breathing noted intermittently, but not sustained.
Biophysical Evaluation

Amniotic F.V:   Pocket => 2 cm two         F. Tone:        Observed
planes
F. Movement:    Observed                   N.S.T:          Reactive
F. Breathing:   Not Observed               Score:          [DATE]
Gestational Age

LMP:           36w 3d       Date:   09/25/16                 EDD:   07/02/17
Best:          36w 3d    Det. By:   LMP  (09/25/16)          EDD:   07/02/17
Impression

IUP at  36wd
Normal amniotic fluid volume
Recommendations

Continue recommended antenatal testing.
Recommend delivery by 39 weeks if maternal and fetal status
remain reassuring.

## 2019-07-23 IMAGING — US US MFM FETAL BPP W/O NON-STRESS
1 series · 14 of 28 positions shown · non-contrast
Comparison: none

[Series 1: us mfm fetal bpp w/o non-stress · 39 acquisitions, 14 frames shown]
[im 2/39]
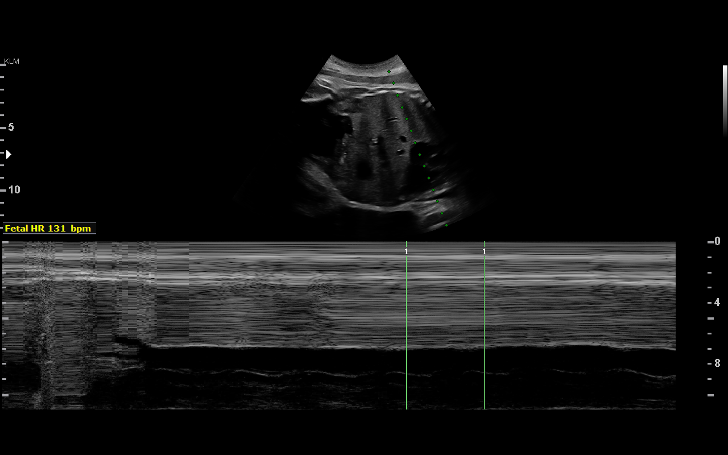
[im 5/39]
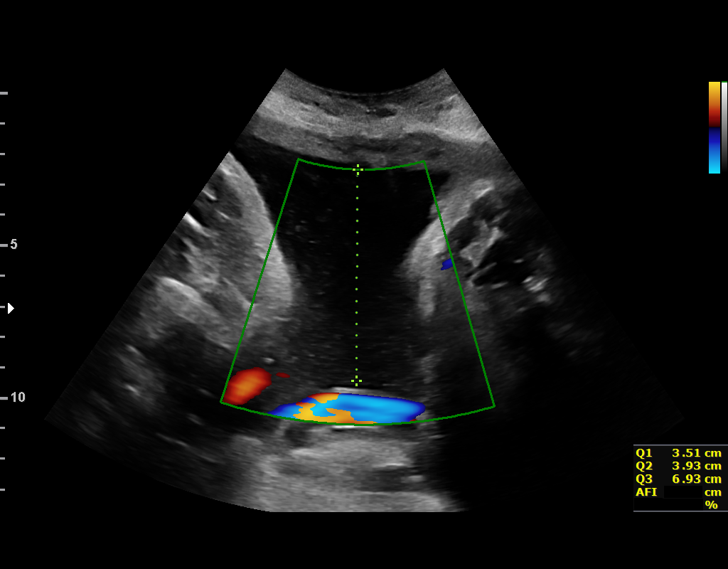
[im 8/39]
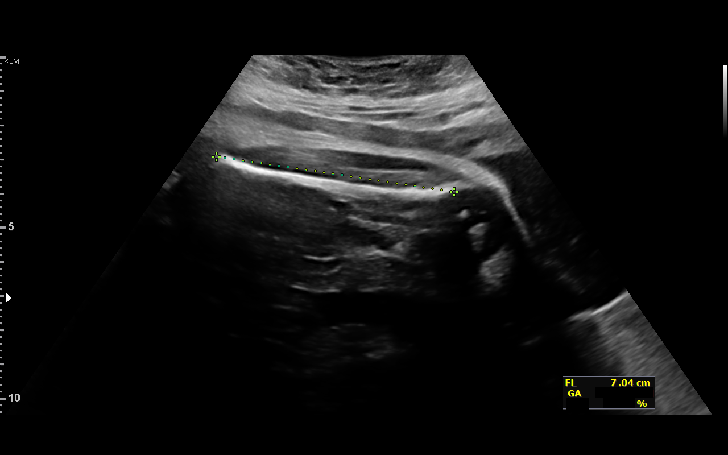
[im 10/39]
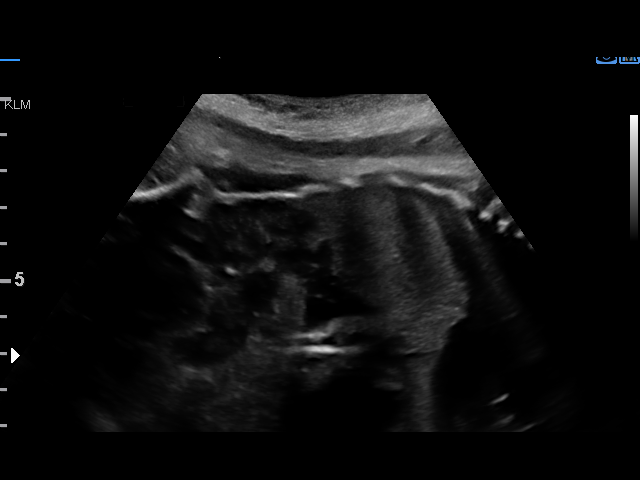
[im 13/39]
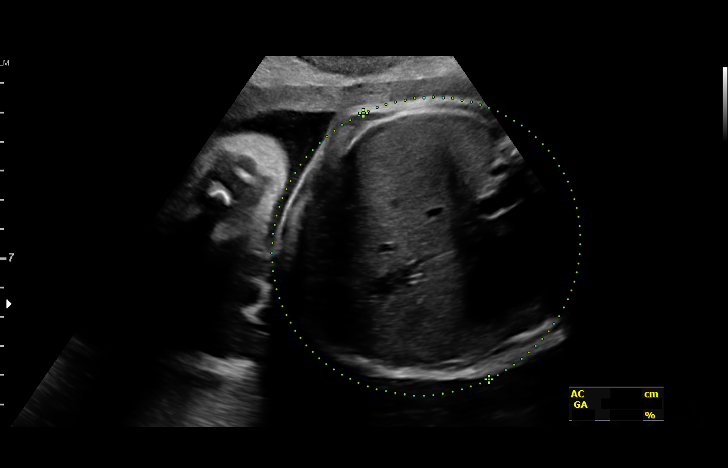
[im 16/39]
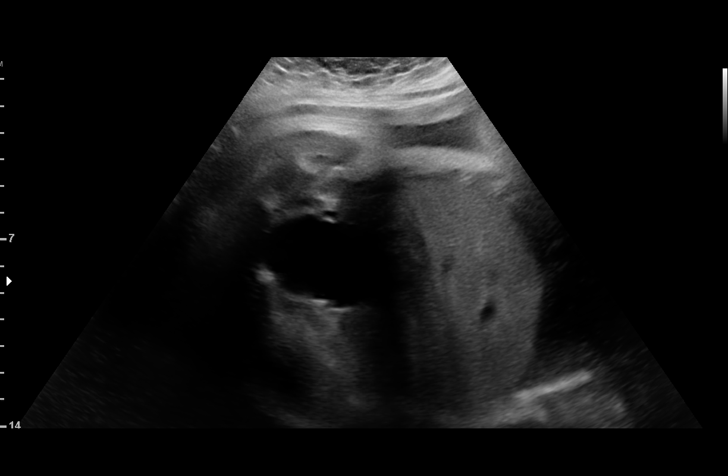
[im 19/39]
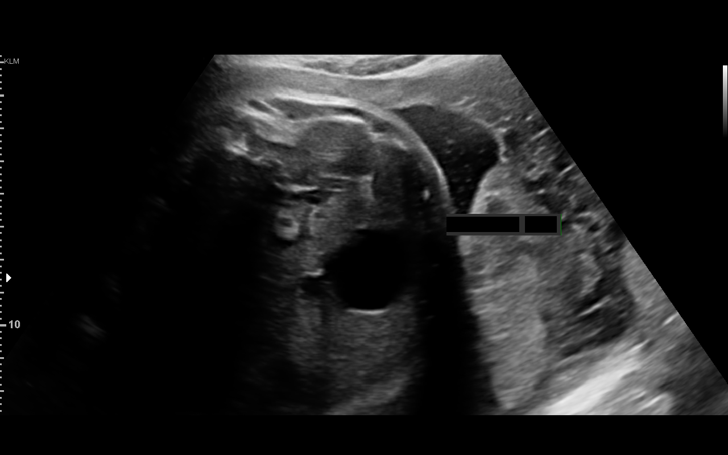
[im 22/39]
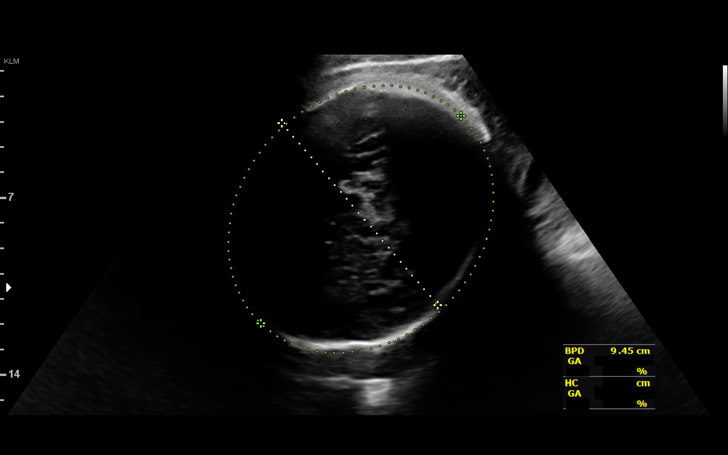
[im 24/39]
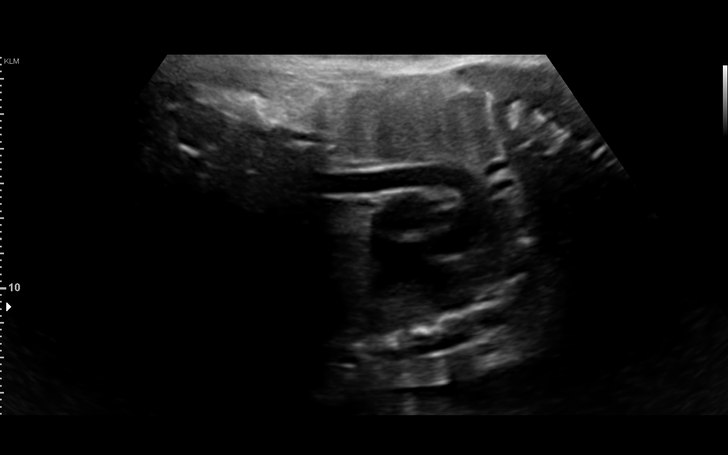
[im 27/39]
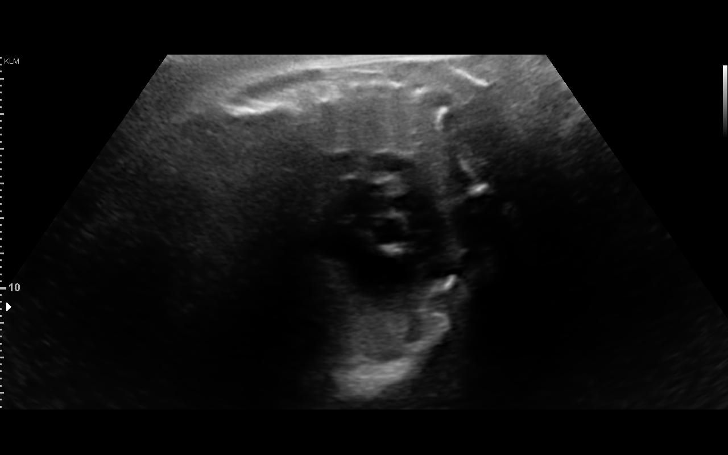
[im 30/39]
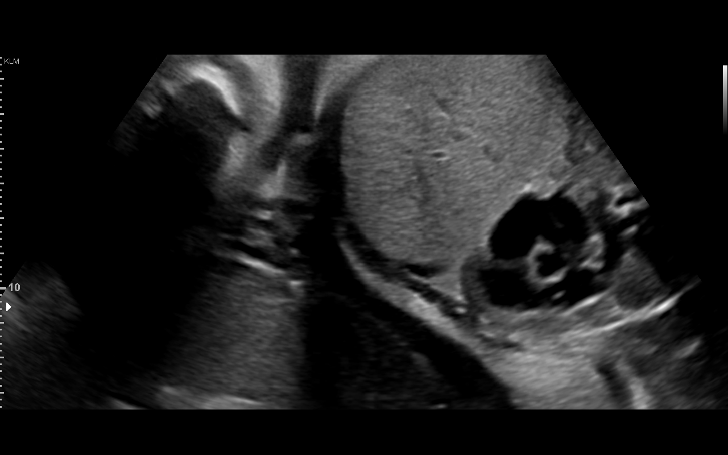
[im 33/39]
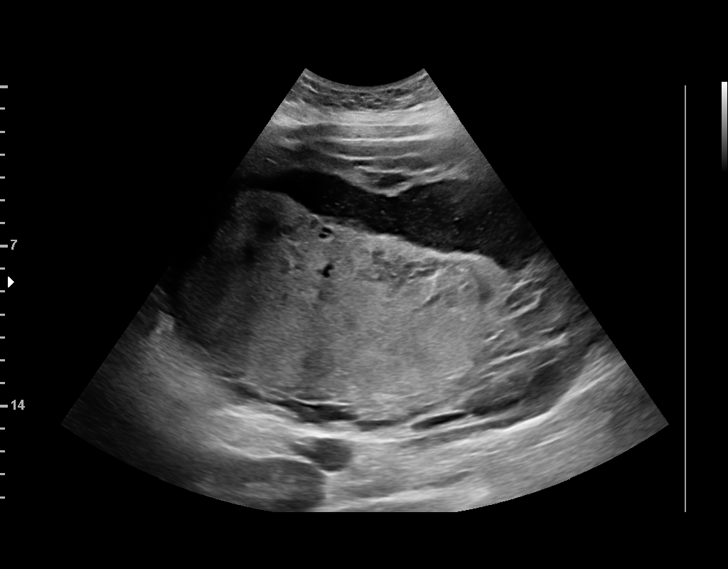
[im 36/39]
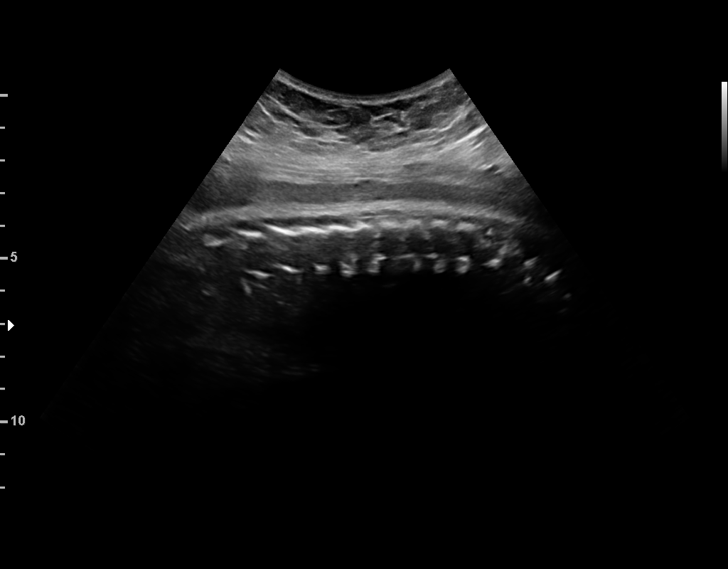
[im 39/39]
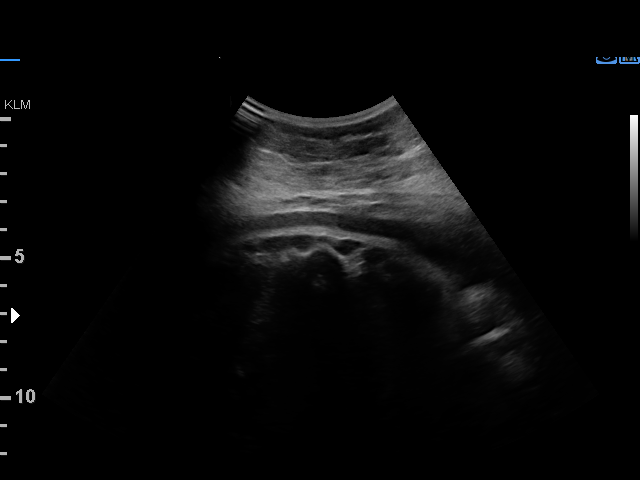

[14 of 28 positions shown; findings below may reference images not displayed]

OB/Gyn Clinic

1  YOSI TAYLOR             [id number]      [id number]     [id number]
2  SILJE CISTERNAS          [id number]      [id number]     [id number]
Indications

37 weeks gestation of pregnancy
Gestational diabetes in pregnancy,
controlled by oral hypoglycemic drugs
Fetal Evaluation

Num Of Fetuses:     1
Fetal Heart         131
Rate(bpm):
Cardiac Activity:   Observed
Presentation:       Cephalic
Placenta:           Posterior, above cervical os
P. Cord Insertion:  Previously Visualized

Amniotic Fluid
AFI FV:      Subjectively within normal limits

AFI Sum(cm)     %Tile       Largest Pocket(cm)
17.76           69

RUQ(cm)       RLQ(cm)       LUQ(cm)        LLQ(cm)
3.51
Biophysical Evaluation

Amniotic F.V:   Within normal limits       F. Tone:        Observed
F. Movement:    Observed                   Score:          [DATE]
F. Breathing:   Observed
Biometry

BPD:      95.9  mm     G. Age:  39w 1d         94  %    CI:        82.08   %    70 - 86
FL/HC:      21.0   %    20.9 -
HC:       334   mm     G. Age:  38w 1d         37  %    HC/AC:      1.02        0.92 -
AC:      327.8  mm     G. Age:  36w 5d         34  %    FL/BPD:     73.0   %    71 - 87
FL:         70  mm     G. Age:  35w 6d         10  %    FL/AC:      21.4   %    20 - 24

Est. FW:    9386  gm    6 lb 12 oz      56  %
Gestational Age

LMP:           37w 6d        Date:  09/25/16                 EDD:   07/02/17
U/S Today:     37w 3d                                        EDD:   07/05/17
Best:          37w 6d     Det. By:  LMP  (09/25/16)          EDD:   07/02/17
Anatomy

Cranium:               Appears normal         Aortic Arch:            Appears normal
Cavum:                 Previously seen        Ductal Arch:            Appears normal
Ventricles:            Previously seen        Diaphragm:              Previously seen
Choroid Plexus:        Previously seen        Stomach:                Appears normal, left
sided
Cerebellum:            Previously seen        Abdomen:                Appears normal
Posterior Fossa:       Previously seen        Abdominal Wall:         Previously seen
Nuchal Fold:           Not applicable (>20    Cord Vessels:           Previously seen
wks GA)
Face:                  Orbits and profile     Kidneys:                Appear normal
previously seen
Lips:                  Previously seen        Bladder:                Appears normal
Thoracic:              Appears normal         Spine:                  Appears normal
Heart:                 Previously seen        Upper Extremities:      Previously seen
RVOT:                  Previously seen        Lower Extremities:      Previously seen
LVOT:                  Previously seen

Other:  Fetus appears to be a male. Nasal bone previously visualized.
Cervix Uterus Adnexa

Cervix
Not visualized (advanced GA >45wks)
Impression

SIUP at 37+6 weeks
Cephalic presentation
Normal interval anatomy; anatomic survey complete
Normal amniotic fluid volume
Appropriate interval growth with EFW at the 56th %tile
BPP [DATE]
Recommendations

Continue antenatal testing
Delivery by 39 weeks

## 2019-10-03 ENCOUNTER — Other Ambulatory Visit: Payer: Self-pay | Admitting: Family Medicine

## 2019-10-03 DIAGNOSIS — M549 Dorsalgia, unspecified: Secondary | ICD-10-CM

## 2019-11-21 ENCOUNTER — Ambulatory Visit: Payer: Medicaid Other | Admitting: Certified Nurse Midwife

## 2020-03-11 ENCOUNTER — Encounter (HOSPITAL_COMMUNITY): Payer: Self-pay | Admitting: Emergency Medicine

## 2020-03-11 ENCOUNTER — Other Ambulatory Visit: Payer: Self-pay

## 2020-03-11 ENCOUNTER — Ambulatory Visit (HOSPITAL_COMMUNITY)
Admission: EM | Admit: 2020-03-11 | Discharge: 2020-03-12 | Disposition: A | Discharge status: Home / Self Care | Payer: Medicaid Other | Attending: Psychiatry | Admitting: Psychiatry

## 2020-03-11 DIAGNOSIS — F332 Major depressive disorder, recurrent severe without psychotic features: Secondary | ICD-10-CM | POA: Diagnosis not present

## 2020-03-11 DIAGNOSIS — Z20822 Contact with and (suspected) exposure to covid-19: Secondary | ICD-10-CM | POA: Diagnosis not present

## 2020-03-11 DIAGNOSIS — F1010 Alcohol abuse, uncomplicated: Secondary | ICD-10-CM

## 2020-03-11 LAB — POCT URINE DRUG SCREEN - MANUAL ENTRY (I-SCREEN)
POC Amphetamine UR: NOT DETECTED
POC Buprenorphine (BUP): NOT DETECTED
POC Cocaine UR: NOT DETECTED
POC Marijuana UR: NOT DETECTED
POC Methadone UR: NOT DETECTED
POC Methamphetamine UR: NOT DETECTED
POC Morphine: NOT DETECTED
POC Oxazepam (BZO): NOT DETECTED
POC Oxycodone UR: NOT DETECTED
POC Secobarbital (BAR): NOT DETECTED

## 2020-03-11 LAB — POC SARS CORONAVIRUS 2 AG -  ED: SARS Coronavirus 2 Ag: NEGATIVE

## 2020-03-11 LAB — POCT PREGNANCY, URINE: Preg Test, Ur: NEGATIVE

## 2020-03-11 MED ORDER — TRAZODONE HCL 50 MG PO TABS
50.0000 mg | ORAL_TABLET | Freq: Every evening | ORAL | Status: DC | PRN
  Administered 2020-03-11: 50 mg via ORAL
  Filled 2020-03-11: qty 1

## 2020-03-11 MED ORDER — HYDROXYZINE HCL 25 MG PO TABS
25.0000 mg | ORAL_TABLET | Freq: Three times a day (TID) | ORAL | Status: DC | PRN
  Administered 2020-03-11: 25 mg via ORAL
  Filled 2020-03-11: qty 1

## 2020-03-11 MED ORDER — MAGNESIUM HYDROXIDE 400 MG/5ML PO SUSP
30.0000 mL | Freq: Every day | ORAL | Status: DC | PRN
  Filled 2020-03-11: qty 30

## 2020-03-11 MED ORDER — ACETAMINOPHEN 325 MG PO TABS: 650.0000 mg | ORAL_TABLET | Freq: Four times a day (QID) | ORAL | Status: DC | PRN

## 2020-03-11 MED ORDER — ALUM & MAG HYDROXIDE-SIMETH 200-200-20 MG/5ML PO SUSP: 30.0000 mL | ORAL | Status: DC | PRN

## 2020-03-11 NOTE — BH Assessment (Signed)
Comprehensive Clinical Assessment (CCA) Note  03/11/2020 Alexandria Beltran [id number] Pt was brought to [facility] by [facility].  She and boyfriend had gotten into an argument. She said she was going to leave.  She went outside for awhile and boyfriend followed her and she yelled at him.  At some point a neighbor called Patent examiner.    Pt says she has been depressed for over a year.  [place]  Patient says she is stuck out there with no phone, no car and no job.  She lives there with boyfriend and their two children.  Patient has been seen by a physician at [facility].  She prescribes patient's tramadol and a antidepressant that she cannot recall the name of.  Pt denies any current SI but said she had some thoughts earlier in the evening.  No plan at this time and no previous attempts.  Patient was upset with boyfriend earlier but she has no HI now.  Patient denies any A/V hallucinations.  Patient also uses ETOH about once a month on average.  Patient says she did drink some around 14:00 today (01/17).  Pt has fair eye contact.  Her affect is congruent with her stated depression and feelings of hopelessness.  Patient is not responding to internal stimuli nor does she evidence any delusional thought process.  Patient thought pattern is clear and coherent.  She reports getting about 5 hours sleep but she also has two small children in the home.  Pt reports her appetite as normal.  Patient has no previous inpatient psychiatric history.  She has a primary care provider prescribing a antidepressant for her. She reports being compliant with taking it.  Patient has no current outpatient provider but she is interested in seeing a counselor/therapist.    -Clinician discussed patient care with Nira Conn, FNP who did her MSE.  Patient will be staying at [facility] for continual assessment and be seen by psychiatry in AM.  Chief Complaint:   Chief Complaint  Patient presents with  . Agitation  . Alcohol Intoxication   Visit Diagnosis: MDD recurrent, severe    CCA Screening, Triage and Referral (STR)  Patient Reported Information How did you hear about [place]? Legal System ([facility] brought patient [facility])  Referral name: No data recorded Referral phone number: No data recorded  Whom do you see for routine medical problems? Primary Care  Practice/Facility Name: [facility]  Practice/Facility Phone Number: No data recorded Name of Contact: Haily Mathune  Contact Number: No data recorded Contact Fax Number: No data recorded Prescriber Name: No data recorded Prescriber Address (if known): No data recorded  What Is the Reason for Your Visit/Call Today? Pt got into an argument with boyfriend.  She left the house and was walking up the road yelling at him.  Psatient said someone had called law enforcement.  [facility].  She says she has been very depressed for about a year.  She and boyfriend have two children (3 yeof and a 2yom).  Pt does feel very depressed becasuse of [place] the country.  Pt says she is on medications.  How Long Has This Been Causing You Problems? > than 6 months  What Do You Feel Would Help You the Most Today? Therapy   Have You Recently Been in Any Inpatient Treatment (Hospital/Detox/Crisis Center/28-Day Program)? No  Name/Location of Program/Hospital:No data recorded How  Long Were You There? No data recorded When Were You Discharged? No data recorded  Have You Ever Re[facility] Before? Yes  Who Do You [facility]? ED visits, last one in 12/2018.   Have You Recently Had Any Thoughts About Hurting Yourself? Yes (Some vague thoughts tonight.)  Are You Planning to Commit Suicide/Harm Yourself At This time? No   Have you Recently Had Thoughts About Hurting Someone Karolee Ohslse? No  Explanation: No data recorded  Have You Used Any Alcohol  or Drugs in the Past 24 Hours? Yes  How Long Ago Did You Use Drugs or Alcohol? 1400  What Did You Use and How Much? A beer   Do You Currently Have a Therapist/Psychiatrist? No  Name of Therapist/Psychiatrist: No data recorded  Have You Been Recently Discharged From Any Office Practice or Programs? No  Explanation of Discharge From Practice/Program: No data recorded    CCA Screening Triage Referral Assessment Type of Contact: Face-to-Face  Is this Initial or Reassessment? No data recorded Date Telepsych consult ordered in [facility]:  No data recorded Time Telepsych consult ordered in [facility]:  No data recorded  Patient Reported Information Reviewed? Yes  Patient Left Without Being Seen? No data recorded Reason for Not Completing Assessment: No data recorded  Collateral Involvement: No data recorded  Does Patient Have a Court Appointed Legal Guardian? No data recorded Name and Contact of Legal Guardian: No data recorded If Minor and Not Living with Parent(s), Who has Custody? No data recorded Is CPS involved or ever been involved? No data recorded Is APS involved or ever been involved? Never   Patient Determined To Be At Risk for Harm To Self or Others Based on Review of Patient Reported Information or Presenting Complaint? No data recorded Method: No data recorded Availability of Means: No data recorded Intent: No data recorded Notification Required: No data recorded Additional Information for Danger to Others Potential: No data recorded Additional Comments for Danger to Others Potential: No data recorded Are There Guns or Other Weapons in Your Home? No data recorded Types of Guns/Weapons: No data recorded Are These Weapons Safely Secured?                            No data recorded Who Could Verify You Are Able To Have These Secured: No data recorded Do You Have any Outstanding Charges, Pending Court Dates, Parole/Probation? No data recorded Contacted To Inform of Risk of Harm  To Self or Others: No data recorded  [facility]   Does Patient Present under Involuntary Commitment? No  IVC Papers Initial File Date: No data recorded  [place]   Patient Currently Receiving the Following Services: Not Receiving Services   Determination of Need: Emergent (2 hours)   Options For Referral: Medication Management; Outpatient Therapy     CCA Biopsychosocial Intake/Chief Complaint:  [place].  He is the father of their two children.  Pt sways she has no job, no phone, no car.  She has had a lot of depression over the last year.  Current Symptoms/Problems: Pt says she has been depressed for over a year.  [place].  Patient says she is stuck out there with no phone, no car and no job.  She lives there with boyfriend and their two children.  Patient has been seen by a physician at [facility].  She prescribes patient's tramadol and a antidepressant that she cannot recall the name of.  Pt denies any current SI but said she had some thoughts earlier in the evening.  No plan at this time and no previous attempts.  Patient was upset with boyfriend earlier but she has no HI now.  Patient denies any A/V hallucinations.  Patient also uses ETOH about once a month on average.  Patient says she did drink some around 14:00 today (01/17).   Patient Reported Schizophrenia/Schizoaffective Diagnosis in Past: No   Strengths: No data recorded Preferences: No data recorded Abilities: No data recorded  Type of Services Patient Feels are Needed: No data recorded  Initial Clinical Notes/Concerns: No data recorded  Mental Health Symptoms Depression:  Change in energy/activity; Hopelessness; Fatigue; Worthlessness; Tearfulness   Duration of Depressive symptoms: No data recorded  Mania:  No data recorded  Anxiety:   Tension; Worrying    Psychosis:  None   Duration of Psychotic symptoms: No data recorded  Trauma:  No data recorded  Obsessions:  No data recorded  Compulsions:  No data recorded  Inattention:  None   Hyperactivity/Impulsivity:  N/A   Oppositional/Defiant Behaviors:  None   Emotional Irregularity:  No data recorded  Other Mood/Personality Symptoms:  No data recorded   Mental Status Exam Appearance and self-care  Stature:  No data recorded  Weight:  No data recorded  Clothing:  No data recorded  Grooming:  No data recorded  Cosmetic use:  No data recorded  Posture/gait:  Slumped   Motor activity:  Slowed   Sensorium  Attention:  Normal   Concentration:  Normal   Orientation:  X5   Recall/memory:  Normal   Affect and Mood  Affect:  Blunted; Depressed   Mood:  Hopeless; Depressed   Relating  Eye contact:  Normal   Facial expression:  Depressed; Sad   Attitude toward examiner:  Cooperative   Thought and Language  Speech flow: Clear and Coherent   Thought content:  Appropriate to Mood and Circumstances   Preoccupation:  No data recorded  Hallucinations:  None   Organization:  No data recorded  [employer] of Knowledge:  Average   Intelligence:  No data recorded  Abstraction:  No data recorded  Judgement:  No data recorded  Reality Testing:  Realistic   Insight:  Fair   Decision Making:  Impulsive   Social Functioning  Social Maturity:  Impulsive   Social Judgement:  Normal   Stress  Stressors:  No data recorded  Coping Ability:  Deficient supports   Skill Deficits:  None   Supports:  Family     Religion:    Leisure/Recreation:    Exercise/Diet: Exercise/Diet Do You Have Any Trouble Sleeping?: Yes Explanation of Sleeping Difficulties: Pt reports getting about 5 hours sleep.   CCA Employment/Education Employment/Work Situation: Employment / Work Psychologist, occupational Employment situation: Unemployed  Education: Education Is Patient  Currently Attending School?: No Did Garment/textile technologist From [employer]?: Yes   CCA Family/Childhood History Family and Relationship History: Family history Marital status: Long term relationship Long term relationship, how long?: Four years What types of issues is patient dealing with in the relationship?: Insecurity and feeling like she is being controlled. Does patient have children?: Yes How many children?: 2  Childhood History:  Childhood History Does patient have siblings?: Yes Number of Siblings: 1 Has patient ever been sexually abused/assaulted/raped as an  adolescent or adult?: No Was the patient ever a victim of a crime or a disaster?: No Witnessed domestic violence?: No Has patient been affected by domestic violence as an adult?: No  Child/Adolescent Assessment:     CCA Substance Use Alcohol/Drug Use: Alcohol / Drug Use Pain Medications: Tramidol Prescriptions: An antideprssant that she can't recall. Over the Counter: Vitamin C, B12 and an iron supplement History of alcohol / drug use?: Yes Substance #1 Name of Substance 1: ETOH 1 - Age of First Use: 30 years of age 46 - Amount (size/oz): Two beers and a half of liquor 1 - Frequency: Once in a month. 1 - Duration: off and on 1 - Last Use / Amount: 01/17.  Pt had one beer.                       ASAM's:  Six Dimensions of Multidimensional Assessment  Dimension 1:  Acute Intoxication and/or Withdrawal Potential:      Dimension 2:  Biomedical Conditions and Complications:      Dimension 3:  Emotional, Behavioral, or Cognitive Conditions and Complications:     Dimension 4:  Readiness to Change:     Dimension 5:  Relapse, Continued use, or Continued Problem Potential:     Dimension 6:  Recovery/Living Environment:     ASAM Severity Score:    ASAM Recommended Level of Treatment:     Substance use Disorder (SUD)    Recommendations for Services/Supports/Treatments:    DSM5 Diagnoses: Patient Active  Problem List   Diagnosis Date Noted  . Hypertension 06/09/2018  . Back pain 06/09/2018  . Anemia 06/26/2017  . Gestational diabetes 06/25/2017  . NSVD (normal spontaneous vaginal delivery) 06/25/2017  . Supervision of high risk pregnancy, antepartum 04/05/2017  . Asthma affecting pregnancy in third trimester 04/05/2017  . GDM, class A2 04/05/2017    Patient Centered Plan: Patient is on the following Treatment Plan(s):  Anxiety and Depression   Referrals to Alternative Service(s): Referred to Alternative Service(s):   Place:   Date:   Time:    Referred to Alternative Service(s):   Place:   Date:   Time:    Referred to Alternative Service(s):   Place:   Date:   Time:    Referred to Alternative Service(s):   Place:   Date:   Time:     Wandra Mannan

## 2020-03-11 NOTE — ED Triage Notes (Signed)
Pt reports she was walking down the street yelling, when [employer] was called on her.  Denies SI or AVH.  Pt reports she was outside the house for 3 hours after argument with her children's father.

## 2020-03-11 NOTE — ED Notes (Addendum)
Alexandria Beltran was called and said they will be a hour late if the driver will commit to the pickup

## 2020-03-11 NOTE — ED Provider Notes (Addendum)
Behavioral Health Admission H&P [facility](FBC & OBS)  Date: 03/12/20 Patient Name: Alexandria Beltran MRN: [id number] Chief Complaint:  Chief Complaint  Patient presents with  . Agitation  . Alcohol Intoxication   Chief Complaint/Presenting Problem: [place].  He is the father of their two children.  Pt sways she has no job, no phone, no car.  She has had a lot of depression over the last year.  Diagnoses:  Final diagnoses:  Severe episode of recurrent major depressive disorder, without psychotic features (HCC)  Alcohol use disorder, mild, abuse    HPI: Alexandria Beltran is a 30 y.o. female with a history of depression and anxiety who [facility] after an argument with her boyfriend and a neighbor called [employer].  On evaluation, patient is lying across two chairs facing a wall in the assessment room. Patient participates in the assessment, however, she does not turn to face the provider. She is alert and oriented x 4. Speech is clear and coherent. She states that she got into an argument with her boyfriend this evening and left walking. She states that [place] a few months ago. She states that is nice having access to nature trails etc, but she gets bored staying at home all day with her children and no one else to talk to. She states that she does not have a phone or car.   Patient reports that she has been feeling depressed for about a year. States that she [facility] that prescribes Cymbalta. She endorses feelings of worthless; tearfulness;  irritability; excessive worry; isolating; decreased appetite, and sleep issues. She reports having suicidal thoughts at times. Denies current suicidal thoughts. She denies a history of suicide attempt. She denies auditory and visual hallucinations. No indication that she is responding to  internal stimuli. No evidence of delusional thought content.   She states that she drinks alcohol about once a month. She states that she had one beer around 2 pm today, but does not feel that alcohol contributed to their argument.     PHQ 2-9:  Flowsheet Row [facility] Routine Pren[facility] Clinical Support [facility]  Thoughts that you would be better off dead, or of hurting yourself in some way Not at all Several days Not at all  PHQ-9 Total Score 0 9 6      Flowsheet Row [facility]  C-SSRS RISK CATEGORY No Risk       Total Time spent with patient: 20 minutes  Musculoskeletal  Strength & Muscle Tone: within normal limits Gait & Station: normal Patient leans: N/A  Psychiatric Specialty Exam  Presentation General Appearance: Disheveled  Eye Contact:Minimal  Speech:Clear and Coherent; Normal Rate  Speech Volume:Normal  Handedness:Right   Mood and Affect  Mood:Anxious; Depressed  Affect:Congruent   Thought Process  Thought Processes:Coherent  Descriptions of Associations:Intact  Orientation:Full (Time, Place and Person)  Thought Content:WDL  Hallucinations:Hallucinations: None  Ideas of Reference:None  Suicidal Thoughts:Suicidal Thoughts: No  Homicidal Thoughts:Homicidal Thoughts: No   Sensorium  Memory:Immediate Good; Recent Good; Remote Good  Judgment:Intact  Insight:Present   Executive Functions  Concentration:Fair  Attention Span:Fair  Recall:Fair  Fund of Knowledge:Good  Language:Good   Psychomotor Activity  Psychomotor Activity:Psychomotor Activity: Normal   Assets  Assets:Communication Skills; Desire for Improvement; Financial Resources/Insurance; Housing; Physical Health; Social Support   Sleep  Sleep:Sleep:  Fair   Physical Exam Constitutional:      General: She is not in acute distress.    Appearance: She is not ill-appearing, toxic-appearing or diaphoretic.  HENT:     Head: Normocephalic.     Right Ear: External ear normal.     Left Ear: External ear normal.  Cardiovascular:     Rate and Rhythm: Normal rate.  Pulmonary:     Effort: Pulmonary effort is normal. No respiratory distress.  Musculoskeletal:        General: Normal range of motion.  Neurological:     Mental Status: She is alert and oriented to person, place, and time.  Psychiatric:        Mood and Affect: Mood is anxious and depressed.        Thought Content: Thought content is not paranoid or delusional. Thought content does not include homicidal or suicidal ideation. Thought content does not include suicidal plan.    Review of Systems  Constitutional: Negative for chills, diaphoresis, fever, malaise/fatigue and weight loss.  HENT: Negative for congestion.   Respiratory: Negative for cough and shortness of breath.   Cardiovascular: Negative for chest pain and palpitations.  Gastrointestinal: Negative for diarrhea, nausea and vomiting.  Neurological: Negative for dizziness and seizures.  Psychiatric/Behavioral: Positive for depression, substance abuse and suicidal ideas. Negative for hallucinations and memory loss. The patient is nervous/anxious and has insomnia.   All other systems reviewed and are negative.   Blood pressure 134/86, pulse 92, temperature 98 F (36.7 C), temperature source Tympanic, resp. rate 18, SpO2 99 %, currently breastfeeding. There is no height or weight on file to calculate BMI.  Past Psychiatric History: MDD   Is the patient at risk to self? No  Has the patient been a risk to self in the past 6 months? No .    Has the patient been a risk to self within the distant past? No   Is the patient a risk to others? No   Has the patient been a risk to others in the past 6 months? No   Has the  patient been a risk to others within the distant past? No   Past Medical History:  Past Medical History:  Diagnosis Date  . Anemia   . Anxiety   . Asthma    Inhaler used 07/22/16  . Chronic back pain   . Gestational diabetes   . Hypocalcemia   . Hyponatremia   . Scoliosis     Past Surgical History:  Procedure Laterality Date  . NO PAST SURGERIES      Family History:  Family History  Problem Relation Age of Onset  . Cancer Mother   . Diabetes Mother   . Alcohol abuse Mother   . Alcohol abuse Maternal Aunt   . Cancer Maternal Uncle   . Alcohol abuse Maternal Uncle     Social History:  Social History   Socioeconomic History  . Marital status: Single    Spouse name: Not on file  . Number of children: Not on file  . Years of education: Not on file  . Highest education level: Not on file  Occupational History  . Not on file  Tobacco Use  . Smoking status: Former Smoker    Packs/day: 0.25    Years: 6.00    Pack years: 1.50  Types: Cigarettes  . Smokeless tobacco: Never Used  Vaping Use  . Vaping Use: Never used  Substance and Sexual Activity  . Alcohol use: No    Comment: occasionally (not with pregnancy)  . Drug use: No  . Sexual activity: Yes    Birth control/protection: None    Comment: undecided between two  Other Topics Concern  . Not on file  Social History Narrative  . Not on file   Social Determinants of Health   Financial Resource Strain: Not on file  Food Insecurity: Not on file  Transportation Needs: Not on file  Physical Activity: Not on file  Stress: Not on file  Social Connections: Not on file  Intimate Partner Violence: Not on file    SDOH:  SDOH Screenings   Alcohol Screen: Not on file  Depression (UYQ0-3): Not on file  Financial Resource Strain: Not on file  Food Insecurity: Not on file  Housing: Not on file  Physical Activity: Not on file  Social Connections: Not on file  Stress: Not on file  Tobacco Use: Medium Risk  .  Smoking Tobacco Use: Former Smoker  . Smokeless Tobacco Use: Never Used  Transportation Needs: Not on file    Last Labs:  Admission on 03/11/2020  Component Date Value Ref Range Status  . SARS Coronavirus 2 by RT PCR 03/11/2020 NEGATIVE  NEGATIVE Final   Comment: (NOTE) SARS-CoV-2 target nucleic acids are NOT DETECTED.  The SARS-CoV-2 RNA is generally detectable in upper respiratory specimens during the acute phase of infection. The lowest concentration of SARS-CoV-2 viral copies this assay can detect is 138 copies/mL. A negative result does not preclude SARS-Cov-2 infection and should not be used as the sole basis for treatment or other patient management decisions. A negative result may occur with  improper specimen collection/handling, submission of specimen other than nasopharyngeal swab, presence of viral mutation(s) within the areas targeted by this assay, and inadequate number of viral copies(<138 copies/mL). A negative result must be combined with clinical observations, patient history, and epidemiological information. The expected result is Negative.  Fact Sheet for Patients:  BloggerCourse.com  Fact Sheet for Healthcare Providers:  SeriousBroker.it  This test is no                          t yet approved or cleared by the [place] FDA and  has been authorized for detection and/or diagnosis of SARS-CoV-2 by FDA under an Emergency Use Authorization (EUA). This EUA will remain  in effect (meaning this test can be used) for the duration of the COVID-19 declaration under Section 564(b)(1) of the Act, 21 U.S.C.section 360bbb-3(b)(1), unless the authorization is terminated  or revoked sooner.      . Influenza A by PCR 03/11/2020 NEGATIVE  NEGATIVE Final  . Influenza B by PCR 03/11/2020 NEGATIVE  NEGATIVE Final   Comment: (NOTE) The Xpert Xpress SARS-CoV-2/FLU/RSV plus assay is intended as an aid in the diagnosis of  influenza from Nasopharyngeal swab specimens and should not be used as a sole basis for treatment. Nasal washings and aspirates are unacceptable for Xpert Xpress SARS-CoV-2/FLU/RSV testing.  Fact Sheet for Patients: BloggerCourse.com  Fact Sheet for Healthcare Providers: SeriousBroker.it  This test is not yet approved or cleared by the [place] FDA and has been authorized for detection and/or diagnosis of SARS-CoV-2 by FDA under an Emergency Use Authorization (EUA). This EUA will remain in effect (meaning this test can be used) for the  duration of the COVID-19 declaration under Section 564(b)(1) of the Act, 21 U.S.C. section 360bbb-3(b)(1), unless the authorization is terminated or revoked.  Performed at [facility], [address], [place], [place] [address]   . SARS Coronavirus 2 Ag 03/11/2020 Negative  Negative Preliminary  . WBC 03/11/2020 7.1  4.0 - 10.5 K/uL Final  . RBC 03/11/2020 4.51  3.87 - 5.11 MIL/uL Final  . Hemoglobin 03/11/2020 13.2  12.0 - 15.0 g/dL Final  . HCT 64/40/3474 41.5  36.0 - 46.0 % Final  . MCV 03/11/2020 92.0  80.0 - 100.0 fL Final  . MCH 03/11/2020 29.3  26.0 - 34.0 pg Final  . MCHC 03/11/2020 31.8  30.0 - 36.0 g/dL Final  . RDW 25/95/6387 11.6  11.5 - 15.5 % Final  . Platelets 03/11/2020 314  150 - 400 K/uL Final  . nRBC 03/11/2020 0.0  0.0 - 0.2 % Final  . Neutrophils Relative % 03/11/2020 60  % Final  . Neutro Abs 03/11/2020 4.3  1.7 - 7.7 K/uL Final  . Lymphocytes Relative 03/11/2020 34  % Final  . Lymphs Abs 03/11/2020 2.4  0.7 - 4.0 K/uL Final  . Monocytes Relative 03/11/2020 4  % Final  . Monocytes Absolute 03/11/2020 0.3  0.1 - 1.0 K/uL Final  . Eosinophils Relative 03/11/2020 1  % Final  . Eosinophils Absolute 03/11/2020 0.1  0.0 - 0.5 K/uL Final  . Basophils Relative 03/11/2020 1  % Final  . Basophils Absolute 03/11/2020 0.0  0.0 - 0.1 K/uL Final  . Immature Granulocytes  03/11/2020 0  % Final  . Abs Immature Granulocytes 03/11/2020 0.03  0.00 - 0.07 K/uL Final   Performed at [facility], [address], [place], [place] [address]  . Sodium 03/11/2020 140  135 - 145 mmol/L Final  . Potassium 03/11/2020 3.8  3.5 - 5.1 mmol/L Final  . Chloride 03/11/2020 103  98 - 111 mmol/L Final  . CO2 03/11/2020 26  22 - 32 mmol/L Final  . Glucose, Bld 03/11/2020 100* 70 - 99 mg/dL Final   Glucose reference range applies only to samples taken after fasting for at least 8 hours.  . BUN 03/11/2020 8  6 - 20 mg/dL Final  . Creatinine, Ser 03/11/2020 0.68  0.44 - 1.00 mg/dL Final  . Calcium 29/51/8841 9.2  8.9 - 10.3 mg/dL Final  . Total Protein 03/11/2020 8.6* 6.5 - 8.1 g/dL Final  . Albumin 66/07/3014 4.1  3.5 - 5.0 g/dL Final  . AST 03/03/3233 19  15 - 41 U/L Final  . ALT 03/11/2020 20  0 - 44 U/L Final  . Alkaline Phosphatase 03/11/2020 50  38 - 126 U/L Final  . Total Bilirubin 03/11/2020 0.5  0.3 - 1.2 mg/dL Final  . GFR, Estimated 03/11/2020 >60  >60 mL/min Final   Comment: (NOTE) Calculated using the CKD-EPI Creatinine Equation (2021)   . Anion gap 03/11/2020 11  5 - 15 Final   Performed at [facility], [address], [place], [place] [address]  . TSH 03/11/2020 0.745  0.350 - 4.500 uIU/mL Final   Comment: Performed by a 3rd Generation assay with a functional sensitivity of <=0.01 uIU/mL. Performed at [facility], [address] [place], [place] [address]   . POC Amphetamine UR 03/11/2020 None Detected  NONE DETECTED (Cut Off Level 1000 ng/mL) Final  . POC Secobarbital (BAR) 03/11/2020 None Detected  NONE DETECTED (Cut Off Level 300 ng/mL) Final  .  POC Buprenorphine (BUP) 03/11/2020 None Detected  NONE DETECTED (Cut Off Level 10 ng/mL) Final  . POC Oxazepam (BZO) 03/11/2020 None Detected  NONE DETECTED (Cut Off Level 300 ng/mL) Final  . POC Cocaine UR 03/11/2020 None Detected  NONE DETECTED (Cut Off Level 300 ng/mL) Final  . POC  Methamphetamine UR 03/11/2020 None Detected  NONE DETECTED (Cut Off Level 1000 ng/mL) Final  . POC Morphine 03/11/2020 None Detected  NONE DETECTED (Cut Off Level 300 ng/mL) Final  . POC Oxycodone UR 03/11/2020 None Detected  NONE DETECTED (Cut Off Level 100 ng/mL) Final  . POC Methadone UR 03/11/2020 None Detected  NONE DETECTED (Cut Off Level 300 ng/mL) Final  . POC Marijuana UR 03/11/2020 None Detected  NONE DETECTED (Cut Off Level 50 ng/mL) Final  . Preg Test, Ur 03/11/2020 NEGATIVE  NEGATIVE Final   Comment:        THE SENSITIVITY OF THIS METHODOLOGY IS >24 mIU/mL     Allergies: Bee pollen and Pollen extract  PTA Medications: (Not in a hospital admission)   Medical Decision Making  Admission orders placed  Continue Cymbalta 30 mg daily for depression   Clinical Course as of 03/12/20 0336  Tue Mar 12, 2020  0207 Preg Test, Ur: NEGATIVE [JB]  0207 SARS Coronavirus 2 by RT PCR: NEGATIVE [JB]  0207 POCT Urine Drug Screen - (ICup) UDS negative [JB]  0243 CBC with Differential/Platelet CBC unremarkable [JB]  0243 Comprehensive metabolic panel(!) CMP unremarkable [JB]    Clinical Course User Index [JB] Jackelyn PolingBerry, Saivon Prowse A, NP    Recommendations  Based on my evaluation the patient does not appear to have an emergency medical condition.   [facility]. She has limited transportation options and due to road conditions, we are unable to arrange transportation tonight.   Jackelyn PolingJason A Cashawn Yanko, NP 03/12/20  3:36 AM

## 2020-03-12 ENCOUNTER — Telehealth (HOSPITAL_COMMUNITY): Payer: Self-pay | Admitting: Family Medicine

## 2020-03-12 LAB — COMPREHENSIVE METABOLIC PANEL
ALT: 20 U/L (ref 0–44)
AST: 19 U/L (ref 15–41)
Albumin: 4.1 g/dL (ref 3.5–5.0)
Alkaline Phosphatase: 50 U/L (ref 38–126)
Anion gap: 11 (ref 5–15)
BUN: 8 mg/dL (ref 6–20)
CO2: 26 mmol/L (ref 22–32)
Calcium: 9.2 mg/dL (ref 8.9–10.3)
Chloride: 103 mmol/L (ref 98–111)
Creatinine, Ser: 0.68 mg/dL (ref 0.44–1.00)
GFR, Estimated: >60 mL/min (ref >60)
Glucose, Bld: 100 mg/dL — ABNORMAL HIGH (ref 70–99)
Potassium: 3.8 mmol/L (ref 3.5–5.1)
Sodium: 140 mmol/L (ref 135–145)
Total Bilirubin: 0.5 mg/dL (ref 0.3–1.2)
Total Protein: 8.6 g/dL — ABNORMAL HIGH (ref 6.5–8.1)

## 2020-03-12 LAB — CBC WITH DIFFERENTIAL/PLATELET
Abs Immature Granulocytes: 0.03 10*3/uL (ref 0.00–0.07)
Basophils Absolute: 0 10*3/uL (ref 0.0–0.1)
Basophils Relative: 1 %
Eosinophils Absolute: 0.1 10*3/uL (ref 0.0–0.5)
Eosinophils Relative: 1 %
HCT: 41.5 % (ref 36.0–46.0)
Hemoglobin: 13.2 g/dL (ref 12.0–15.0)
Immature Granulocytes: 0 %
Lymphocytes Relative: 34 %
Lymphs Abs: 2.4 10*3/uL (ref 0.7–4.0)
MCH: 29.3 pg (ref 26.0–34.0)
MCHC: 31.8 g/dL (ref 30.0–36.0)
MCV: 92 fL (ref 80.0–100.0)
Monocytes Absolute: 0.3 10*3/uL (ref 0.1–1.0)
Monocytes Relative: 4 %
Neutro Abs: 4.3 10*3/uL (ref 1.7–7.7)
Neutrophils Relative %: 60 %
Platelets: 314 10*3/uL (ref 150–400)
RBC: 4.51 MIL/uL (ref 3.87–5.11)
RDW: 11.6 % (ref 11.5–15.5)
WBC: 7.1 10*3/uL (ref 4.0–10.5)
nRBC: 0 % (ref 0.0–0.2)

## 2020-03-12 LAB — TSH: TSH: 0.745 u[IU]/mL (ref 0.350–4.500)

## 2020-03-12 LAB — RESP PANEL BY RT-PCR (FLU A&B, COVID) ARPGX2
Influenza A by PCR: NEGATIVE
Influenza B by PCR: NEGATIVE
SARS Coronavirus 2 by RT PCR: NEGATIVE

## 2020-03-12 MED ORDER — DULOXETINE HCL 30 MG PO CPEP
30.0000 mg | ORAL_CAPSULE | Freq: Every day | ORAL | Status: DC
  Administered 2020-03-12: 30 mg via ORAL
  Filled 2020-03-12: qty 1

## 2020-03-12 NOTE — Discharge Instructions (Addendum)
Please come [facility]this facility) during walk in hours for appointment with psychiatrist for further medication management and for therapy.   Walk in hours are 8-11 AM Monday through Thursday for medication management.It is first come, first -serve; it is best to arrive by 7:30 AM. On Friday from 1 pm to 4 pm for therapy intake only. Please arrive by 12:00 pm as it is  first come, first -serve.   When you arrive please go upstairs for your appointment. If you are unsure of where to go, inform the front desk that you are here for a walk in appointment and they will assist you with directions upstairs.  Address:  [address], [place], [id number] Ph: [phone]    Emergency Department Resource Guide 1) Find a Doctor and Pay Out of Pocket Although you won't have to find out who is covered by your insurance plan, it is a good idea to ask around and get recommendations. You will then need to call the office and see if the doctor you have chosen will accept you as a new patient and what types of options they offer for patients who are self-pay. Some doctors offer discounts or will set up payment plans for their patients who do not have insurance, but you will need to ask so you aren't surprised when you get to your appointment.  2) Contact Your Local Health Department Not all health departments have doctors that can see patients for sick visits, but many do, so it is worth a call to see if yours does. If you don't know where your local health department is, you can check in your phone book. The CDC also has a tool to help you locate your state's health department, and many state websites also have listings of all of their local health departments.  3) Find a Walk-in Clinic If your illness is not likely to be very severe or complicated, you may want to try a walk in clinic. These are popping up all over the country in pharmacies, drugstores, and shopping centers. They're  usually staffed by nurse practitioners or physician assistants that have been trained to treat common illnesses and complaints. They're usually fairly quick and inexpensive. However, if you have serious medical issues or chronic medical problems, these are probably not your best option.  No Primary Care Doctor: Call Health Connect at  [phone] - they can help you locate a primary care doctor that  accepts your insurance, provides certain services, etc. Physician Referral Service- [phone]  Chronic Pain Problems: Organization         Address  Phone   Notes  [facility]  [phone] Patients need to be referred by their primary care doctor.   Medication Assistance: Organization         Address  Phone   Notes  [facility] [address] [place], [place] [address] [phone] --Must be a resident of [facility] [place] -- Must have NO insurance coverage whatsoever (no Medicaid/ Medicare, etc.) -- The pt. MUST have a primary care doctor that directs their care regularly and follows them in the community   [facility]ist  [phone]   [employer]  [phone]    Agencies that provide inexpensive medical care: Organization         Address  Phone   Notes  [facility]  [phone]   [facility]    (  [phone]   [facility] [address] [place], [place] [address] [phone]   [facility] [address] [place] [place] [phone]   Planned Parenthood    [phone]   [facility]    [phone]   [facility]  [address], [place] Phone:  [phone], Fax:  [phone] Hours of Operation:  9 am - 6 pm, M-F.  Also accepts Medicaid/Medicare and self-pay.  [facility]  [address], [place] Phone: [phone], Fax: [phone]. Hours of  Operation:  8:30 am - 5:30 pm, M-F.  Also accepts Medicaid and self-pay.  [facility] [place] [address], [place] Phone: [phone]   [facility] [address] Natasha Bence [place], [place] [phone], Ext. [phone] Mondays & Thursdays: 7-9 AM.  First 15 patients are seen on a first come, first serve basis.    Medicaid-accepting [facility] Providers:  Organization         Address  Phone   Notes  [facility] [address], [place] [phone] Also accepts self-pay patients.  [facility] [address] Laurell Josephs [place], [place]  [phone]   [facility] [address], [place] [phone]   [facility] [address] [place] [phone]   Renaye Rakers [address], [place]   [phone] Only accepts [place] patients after they have their name applied to their card.   Self-Pay (no insurance) in [facility]:  Organization         Address  Phone   Notes  Sickle Cell Patients, [facility] [address] [place], [place] [phone]   [facility] [address] [place], [place] [phone]   [facility] [place]  [address], [place] [phone]   [facility]/Dr. Osei-Bonsu  [address] [place] or [address], [place] [phone] Phone number for both [place] and [place] locations is the same.  [facility] [address] [place] [phone]   [facility] [address], [place] or [address] [phone]   [facility] [address] [place] [phone], phone; [phone], fax Sees patients 1st and 3rd Saturday of every month.  Must not qualify for public or private insurance (i.e. Medicaid, Medicare,  [place] Health Choice, Veterans' Benefits)  Household income should be no more than 200% of the poverty level The clinic cannot treat you if you are pregnant or think you are pregnant  Sexually transmitted diseases are not treated at the clinic.    Dental Care: Organization         Address  Phone  Notes  [facility] Department of [facility] [address] [place], [place] [phone] Accepts children up to age 72 who are enrolled in [place] or [place] Health Choice; pregnant women with a Medicaid card; and children who have applied for Medicaid or [place] Health Choice, but were declined, whose parents can pay a reduced fee at time of service.  [facility] Department of [facility]  [address], [place] [phone] Accepts children up  to age 64 who are enrolled in Medicaid or [employer] Health Choice; pregnant women with a Medicaid card; and children who have applied for Medicaid or  Health Choice, but were declined, whose parents can pay a reduced fee at time of service.  [facility]  [address] [place], [place] [phone] Patients are seen by appointment only. Walk-ins are not accepted. [facility] will see patients 71 years of age and older. Monday - Tuesday (8am-5pm) Most Wednesdays (8:30-5pm) $30 per visit, cash only  [facility] Adult [facility] Access PRO[facility]  [address], [facility] [phone] Patients are seen by appointment only. Walk-ins are not accepted. [facility] will see patients 55 years of age and older. One Wednesday Evening (Monthly: Volunteer Based).  $30 per visit, cash only  [employer]  [phone] for adults; Children under age 76, call Graduate Pediatric Dentistry at [phone]. Children aged 74-14, please call [phone] to request a pediatric application.  Dental services are provided in all areas of dental care including fillings, crowns and bridges,  complete and partial dentures, implants, gum treatment, root canals, and extractions. Preventive care is also provided. Treatment is provided to both adults and children. Patients are selected via a lottery and there is often a waiting list.   [facility] [address] [place]  [phone] [email or web address]   [facility] [address] [place], [place] [phone], Ext. [phone] Second and Fourth Thursday of each month, opens at 6:30 AM; Clinic ends at 9 AM.  Patients are seen on a first-come first-served basis, and a limited number are seen during each clinic.   [facility]  [address] Ether Griffins [place], [place] [phone]   Eligibility Requirements You must have lived in [place], [place], or [place] counties for at least the last three months.   You cannot be eligible for state or federal sponsored [place], including [employer], [place], or [employer].   You generally cannot be eligible for healthcare insurance through your employer.    How to apply: Eligibility screenings are held every Tuesday and Wednesday afternoon from 1:00 pm until 4:00 pm. You do not need an appointment for the interview!  [facility] [address] [place], [place] [phone]   [facility] Health Department  [phone]   [facility] Health Department  [phone]   [facility] Health Department  [phone]    Behavioral Health Resources in the Community: Intensive Outpatient Programs Organization         Address  Phone  Notes  [facility] [address] [place], [place] [phone]   [facility] [address] [place], [place] [phone]   [facility] [address], [place], [place]  [phone]   [facility] [address]  [place], [place] [phone] or [phone]   Substance Abuse Resources Organization          Address  Phone  Notes  [facility]  [phone]   [facility]  [phone]   [facility]  [phone]   Floydene Flock  [phone]   Residential & Outpatient Substance Abuse Program  [phone]   Psychological Services Organization         Address  Phone  Notes  [facility] Behavior[facility] Health  [phone]   [place] [facility]  [phone]   [facility]  [address], [place] [phone] or [phone]    Mobile Crisis Teams Organization         Address  Phone  Notes  [facility], Mobile Crisis Care Unit  [phone]   [facility]  [address] [place], [place] [phone]   Doristine Locks [address] [place] [phone]    Self-Help/Support Groups Organization         Address  Phone             Notes  [facility] [place] - variety of support groups  336- I7437963 Call for more information  Narcotics Anonymous (NA), [facility] [address], [employer] [place]  2 meetings at this location   Statistician         Address  Phone  Notes  [facility] [address],    [place]  [phone]   [facility]  [address] [place] [address], [place], [place] [phone]   [facility] [address] [place], [place] [phone] Admissions: 8am-3pm M-F  [facility] [address],    [place], [place] [phone]   [facility] [address] [place], [place], [place] [phone]   [facility] [address]  [place], [place] [phone]   [facility] - Intensive Outpatient [address] Laurell Josephs [address], [place], [place] [phone]   [facility] ([facility]iction [facility] [address] [place].,  [place], [place] [phone] or [phone]   [facility] ([facility]) [address] [place], [place]  [phone] Accepts Medicaid  [facility] [address],  [place] [phone] Substance Abuse/Addiction Treatment   [facility] Organization         Address  Phone  Notes  [facility]  [phone]   Angie Fava, PhD [address] Ervin Knack [place], [place]   [phone] or [phone]   [facility] Behavioral   [address] [place], [place] [phone]   [facility] [address] [place], [place] [phone] Insurance/Medicaid/sponsorship through [facility] and Families [address]                                    [place], [place] [phone] Therapy/tele-psych/case  [facility] [address], [place] [phone]    Dr. Lolly Mustache  [phone]   [facility]  [employer] [facility] Dept. 1) [address] [place] 2) [address], [place] 3)  [address], [place] [phone]  [phone]   [facility] Child Abuse Hotline (512) 878-0189 or [phone] (After Hours)

## 2020-03-12 NOTE — ED Notes (Signed)
Pt sleeping@this time. Breathing even and unlabored. No c/o of pain or distress noted. Will continue to monitor for safety 

## 2020-03-12 NOTE — ED Notes (Signed)
Breakfast given.  

## 2020-03-12 NOTE — ED Notes (Signed)
Pt resting in pull out bed w/o any distress. Safety maintained and will continue to monitor.
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
## 2020-03-12 NOTE — ED Notes (Signed)
Discharge instructions provided and Pt stated understanding. [facility] called for services to take Pt home. Safety maintained and will continue to monitor.

## 2020-03-12 NOTE — Telephone Encounter (Signed)
Care Management - Follow Up [facility] Discharges   Writer attempted to make contact with patient today and was unsuccessful.  Writer was able to leave a HIPPA compliant voice message and will await callback.   

## 2020-12-09 ENCOUNTER — Ambulatory Visit (INDEPENDENT_AMBULATORY_CARE_PROVIDER_SITE_OTHER): Payer: Medicaid Other | Admitting: Plastic Surgery

## 2020-12-09 ENCOUNTER — Encounter: Payer: Self-pay | Admitting: Plastic Surgery

## 2020-12-09 ENCOUNTER — Other Ambulatory Visit: Payer: Self-pay

## 2020-12-09 VITALS — BP 120/78 | HR 87 | Ht 72.0 in | Wt 302.0 lb

## 2020-12-09 DIAGNOSIS — N62 Hypertrophy of breast: Secondary | ICD-10-CM

## 2020-12-09 DIAGNOSIS — M546 Pain in thoracic spine: Secondary | ICD-10-CM

## 2020-12-09 DIAGNOSIS — G8929 Other chronic pain: Secondary | ICD-10-CM | POA: Diagnosis not present

## 2020-12-09 NOTE — Progress Notes (Signed)
Referring Provider No referring provider defined for this encounter.   CC:  Breast hypertrophy and back pain.  Alexandria Beltran is an 30 y.o. female.  HPI:  Mammary Hyperplasia: The patient is a 30 y.o. female with a history of mammary hyperplasia for several years.  She has extremely large breasts causing symptoms that include the following: Back pain in the upper and lower back, including neck pain. She pulls or pins her bra straps to provide better lift and relief of the pressure and pain. She notices relief by holding her breast up manually.  Her shoulder straps cause grooves and pain and pressure that requires padding for relief. Pain medication is sometimes required with motrin and tylenol.  Activities that are hindered by enlarged breasts include: exercise and running.  She has tried supportive clothing as well as fitted bras without improvement.  She sometimes takes Tramadol for her severe thoracic back pain.  She has a history of scoliosis but notes that her back pain did not start until her breasts became large.  Her breasts are extremely large and fairly symmetric but her left breast is larger.  She has hyperpigmentation of the inframammary area on both sides. The BMI = 40.  Preoperative bra size = I cup.   Mammogram history: No mammogram due to young age.  Family history of breast cancer:  none.  Tobacco use:  Currently using nicotine vape.   The patient expresses the desire to pursue surgical intervention.   Allergies  Allergen Reactions   Bee Pollen    Pollen Extract     Outpatient Encounter Medications as of 12/09/2020  Medication Sig Note   albuterol (VENTOLIN HFA) 108 (90 Base) MCG/ACT inhaler Inhale 1-2 puffs into the lungs every 6 (six) hours as needed for wheezing or shortness of breath.    celecoxib (CELEBREX) 200 MG capsule Take 200 mg by mouth 2 (two) times daily.    DULoxetine (CYMBALTA) 30 MG capsule Take 30 mg by mouth daily.    traMADol (ULTRAM) 50 MG tablet Take  50 mg by mouth 2 (two) times daily. 03/12/2020: 30 day supply Last filled 01/08/2020 per [facility]   No facility-administered encounter medications on file as of 12/09/2020.     Past Medical History:  Diagnosis Date   Anemia    Anxiety    Asthma    Inhaler used 07/22/16   Chronic back pain    Gestational diabetes    Hypocalcemia    Hyponatremia    Scoliosis     Past Surgical History:  Procedure Laterality Date   NO PAST SURGERIES      Family History  Problem Relation Age of Onset   Cancer Mother    Diabetes Mother    Alcohol abuse Mother    Alcohol abuse Maternal Aunt    Cancer Maternal Uncle    Alcohol abuse Maternal Uncle     Social History   Social History Narrative   Not on file     Review of Systems General: Denies fevers, chills, weight loss CV: Denies chest pain, shortness of breath, palpitations   Physical Exam Vitals with BMI 12/09/2020 01/15/2019 01/15/2019  Height 6\' 0"  - -  Weight 302 lbs - -  BMI 40.95 - -  Systolic 120 123  Diastolic 78 82 90  Pulse 87 70 84  Some encounter information is confidential and restricted. Go to Review Flowsheets activity to see all data.     General:  No acute distress,  Alert and  oriented, Non-Toxic, Normal speech and affect Breast: Macromastia, significant breast ptosis bilateral breasts. The sternal to nipple distance on the right is 42 cm and the left is 42 cm.  The IMF distance is 17 on the right and 22 cm on the left.    Assessment/Plan Patient is a candidate for bilateral breast reduction in the future but prior to this I would like for her to be completely off all nicotine products for at least 2 months.  We discussed smoking cessation and abstaining from all nicotine products perioperatively.  We discussed urine cotinine test after she has used no nicotine products for at least 4 weeks.  She is agreeable to this.  I estimate that we could remove over 1300 grams on both sides at the time of  surgery.   Time based coding: 33 minutes were spent with the patient.  Greater than 50% was spent on counseling cordination of care.    Treena Cosman 12/09/2020, 11:01 AM

## 2021-01-20 ENCOUNTER — Ambulatory Visit (INDEPENDENT_AMBULATORY_CARE_PROVIDER_SITE_OTHER): Payer: Medicaid Other | Admitting: Plastic Surgery

## 2021-01-20 DIAGNOSIS — M546 Pain in thoracic spine: Secondary | ICD-10-CM | POA: Diagnosis not present

## 2021-01-20 DIAGNOSIS — N62 Hypertrophy of breast: Secondary | ICD-10-CM

## 2021-01-20 DIAGNOSIS — G8929 Other chronic pain: Secondary | ICD-10-CM | POA: Diagnosis not present

## 2021-01-22 NOTE — Progress Notes (Signed)
   Referring Provider Burnice Logan, [place] [address] [place],  [place] [address]   CC: breast hypertrophy and back pain    Alexandria Beltran is an 30 y.o. female.  HPI: 30 year old with a history of breast hypertrophy and back pain.  She is currently working with a weight loss physician and as lost 2-3 lbs since her last visit.    Review of Systems General: no fever, no chills, intended weight loss only.  Physical Exam Vitals with BMI 12/09/2020 01/15/2019 01/15/2019  Height 6\' 0"  - -  Weight 302 lbs - -  BMI 40.95 - -  Systolic 120 123  Diastolic 78 82 90  Pulse 87 70 84  Some encounter information is confidential and restricted. Go to Review Flowsheets activity to see all data.    General:  No acute distress,  Alert and oriented, Non-Toxic, Normal speech and affect Breast:  unchanged on exam, macromastia with significant breast ptosis.   Assessment/Plan Recommended continued weight loss to at least get below a BMI of 50.  She will continue on her current regimen and see 440 back in several months.  Time based coding: 7 minutes were spent with the patient.  Greater than 50% was spent on counseling cordination of care.  We discussed risks associated with breat reduction at hight weights.  Went over the surgery again.   [place] 01/22/2021, 11:24 AM

## 2021-03-20 ENCOUNTER — Telehealth: Payer: Self-pay

## 2021-03-20 NOTE — Telephone Encounter (Signed)
LMVM-if patient has quit smoking. If so, when

## 2021-12-29 ENCOUNTER — Other Ambulatory Visit (HOSPITAL_COMMUNITY): Payer: Self-pay

## 2022-08-02 ENCOUNTER — Encounter (HOSPITAL_BASED_OUTPATIENT_CLINIC_OR_DEPARTMENT_OTHER): Payer: Self-pay

## 2022-08-02 ENCOUNTER — Other Ambulatory Visit: Payer: Self-pay

## 2022-08-02 ENCOUNTER — Emergency Department (HOSPITAL_BASED_OUTPATIENT_CLINIC_OR_DEPARTMENT_OTHER)
Admission: EM | Admit: 2022-08-02 | Discharge: 2022-08-02 | Disposition: A | Discharge status: Home / Self Care | Payer: Medicaid Other | Attending: Emergency Medicine | Admitting: Emergency Medicine

## 2022-08-02 DIAGNOSIS — J45909 Unspecified asthma, uncomplicated: Secondary | ICD-10-CM | POA: Diagnosis not present

## 2022-08-02 DIAGNOSIS — J020 Streptococcal pharyngitis: Secondary | ICD-10-CM | POA: Insufficient documentation

## 2022-08-02 DIAGNOSIS — J029 Acute pharyngitis, unspecified: Secondary | ICD-10-CM | POA: Diagnosis present

## 2022-08-02 DIAGNOSIS — I10 Essential (primary) hypertension: Secondary | ICD-10-CM | POA: Insufficient documentation

## 2022-08-02 LAB — GROUP A STREP BY PCR: Group A Strep by PCR: DETECTED — AB

## 2022-08-02 MED ORDER — LIDOCAINE VISCOUS HCL 2 % MT SOLN
15.0000 mL | Freq: Once | OROMUCOSAL | Status: AC
  Administered 2022-08-02: 15 mL via OROMUCOSAL
  Filled 2022-08-02: qty 15

## 2022-08-02 MED ORDER — LIDOCAINE VISCOUS HCL 2 % MT SOLN: 15.0000 mL | OROMUCOSAL | 0 refills | Status: AC | PRN

## 2022-08-02 MED ORDER — ACETAMINOPHEN 500 MG PO TABS: 500.0000 mg | ORAL_TABLET | Freq: Four times a day (QID) | ORAL | 0 refills | Status: AC | PRN

## 2022-08-02 MED ORDER — PENICILLIN G BENZATHINE 1200000 UNIT/2ML IM SUSY
1.2000 10*6.[IU] | PREFILLED_SYRINGE | Freq: Once | INTRAMUSCULAR | Status: AC
  Administered 2022-08-02: 1.2 10*6.[IU] via INTRAMUSCULAR
  Filled 2022-08-02: qty 2

## 2022-08-02 MED ORDER — ACETAMINOPHEN 500 MG PO TABS
1000.0000 mg | ORAL_TABLET | Freq: Once | ORAL | Status: AC
  Administered 2022-08-02: 1000 mg via ORAL
  Filled 2022-08-02: qty 2

## 2022-08-02 NOTE — ED Triage Notes (Signed)
Pt BIB by [facility] c/o sore throat, bilateral ear aches, generalized body aches since Friday. Denies NVD, chest pain, abd pain. Pt having difficulty speaking due to sore throat. Pt has been spitting out saliva rather than swallowing due to sore throat

## 2022-08-02 NOTE — ED Notes (Signed)
Discharge instructions discussed with pt. Pt verbalized understanding. Pt stable and ambulatory.  °

## 2022-08-02 NOTE — ED Notes (Signed)
Pt able to swallow water and PO tylenol

## 2022-08-02 NOTE — ED Provider Notes (Signed)
Alexandria Beltran EMERGENCY DEPARTMENT AT [facility] Provider Note   CSN: [id number] Arrival date & time: 08/02/22  1841     History  Chief Complaint  Patient presents with   Sore Throat    Alexandria Beltran is a 32 y.o. female.  The history is provided by the patient. No language interpreter was used.  Sore Throat     32 year old female significant history of asthma, anemia, hypertension, presenting for evaluation of sore throat.  For the past 3 days patient has had sore throat, bilateral ear pains, body aches, and having trouble swallowing.  Symptoms moderate in severity.  Endorsed objective fever and chills.  Denies nausea vomiting diarrhea chest pain abdominal pain no shortness of breath no productive cough.  Denies any recent sick contact.  She tried over-the-counter medication at home without relief.  Home Medications Prior to Admission medications   Medication Sig Start Date End Date Taking? Authorizing Provider  albuterol (VENTOLIN HFA) 108 (90 Base) MCG/ACT inhaler Inhale 1-2 puffs into the lungs every 6 (six) hours as needed for wheezing or shortness of breath. 08/01/18   Mardella Layman, MD  celecoxib (CELEBREX) 200 MG capsule Take 200 mg by mouth 2 (two) times daily. Patient not taking: Reported on 12/09/2020 03/11/20   [provider]  DULoxetine (CYMBALTA) 30 MG capsule Take 30 mg by mouth daily. Patient not taking: Reported on 12/09/2020 02/13/20   [provider]  traMADol (ULTRAM) 50 MG tablet Take 50 mg by mouth 2 (two) times daily.    [provider]      Allergies    Bee pollen and Pollen extract    Review of Systems   Review of Systems  All other systems reviewed and are negative.   Physical Exam Updated Vital Signs BP 131/76   Pulse (!) 106   Temp 99.7 F (37.6 C) (Oral)   Resp 15   Ht 6' (1.829 m)   Wt 136.1 kg   SpO2 97%   BMI 40.69 kg/m  Physical Exam Vitals and nursing note reviewed.  Constitutional:      General:  She is not in acute distress.    Appearance: She is well-developed. She is obese.  HENT:     Head: Atraumatic.     Right Ear: Tympanic membrane normal.     Left Ear: Tympanic membrane normal.     Nose: No congestion.     Mouth/Throat:     Tonsils: No tonsillar exudate or tonsillar abscesses. 2+ on the right. 2+ on the left.     Comments: Uvula midline bilateral tonsillar enlargement without exudates.  No significant posterior oropharyngeal erythema no peritonsillar abscess no trismus or stridor. Eyes:     Conjunctiva/sclera: Conjunctivae normal.  Cardiovascular:     Rate and Rhythm: Tachycardia present.     Heart sounds: Normal heart sounds.  Pulmonary:     Effort: Pulmonary effort is normal.  Abdominal:     Palpations: Abdomen is soft.     Tenderness: There is no abdominal tenderness.  Musculoskeletal:     Cervical back: Neck supple.  Skin:    Findings: No rash.  Neurological:     Mental Status: She is alert.  Psychiatric:        Mood and Affect: Mood normal.     ED Results / Procedures / Treatments   Labs (all labs ordered are listed, but only abnormal results are displayed) Labs Reviewed  GROUP A STREP BY PCR - Abnormal; Notable for the following components:  Result Value   Group A Strep by PCR DETECTED (*)    All other components within normal limits    EKG None  Radiology No results found.  Procedures Procedures    Medications Ordered in ED Medications  penicillin g benzathine (BICILLIN LA) 1200000 UNIT/2ML injection 1.2 Million Units (has no administration in time range)  lidocaine (XYLOCAINE) 2 % viscous mouth solution 15 mL (15 mLs Mouth/Throat Given 08/02/22 1930)  acetaminophen (TYLENOL) tablet 1,000 mg (1,000 mg Oral Given 08/02/22 1943)    ED Course/ Medical Decision Making/ A&P                             Medical Decision Making Risk OTC drugs. Prescription drug management.   BP 133/78   Pulse 100   Temp 99.7 F (37.6 C) (Oral)    Resp 18   Ht 6' (1.829 m)   Wt 136.1 kg   SpO2 98%   BMI 40.69 kg/m   24:70 PM  33 year old female significant history of asthma, anemia, hypertension, presenting for evaluation of sore throat.  For the past 3 days patient has had sore throat, bilateral ear pains, body aches, and having trouble swallowing.  Symptoms moderate in severity.  Endorsed objective fever and chills.  Denies nausea vomiting diarrhea chest pain abdominal pain no shortness of breath no productive cough.  Denies any recent sick contact.  She tried over-the-counter medication at home without relief.  On exam, obese female laying in bed appears slightly uncomfortable.  She does have difficulty talking due to her discomfort.  Ear nose exam unremarkable, throat remarkable for bilateral tonsillar enlargement without exudates.  Uvula is midline no obvious signs concerning for peritonsillar abscess or retropharyngeal abscess.  Neck is supple, trachea midline.  She is mildly tachycardic and warm to the touch.  Lungs clear abdomen is soft nontender no splenomegaly.  -Labs ordered, independently viewed and interpreted by me.  Labs remarkable for strep positive -The patient was maintained on a cardiac monitor.  I personally viewed and interpreted the cardiac monitored which showed an underlying rhythm of: sinus tachycardia -Imaging including neck CT considered but not performed as I have low suspicion for deep tissue infection -This patient presents to the ED for concern of sore throat, this involves an extensive number of treatment options, and is a complaint that carries with it a high risk of complications and morbidity.  The differential diagnosis includes strep, mono, PTA, RPA, Lemiere's disease -Co morbidities that complicate the patient evaluation includes asthma, gestational diabetes -Treatment includes bicillin, viscous lidocaine, tylenol -Reevaluation of the patient after these medicines showed that the patient improved -PCP  office notes or outside notes reviewed -Escalation to admission/observation considered: patients feels much better, is comfortable with discharge, and will follow up with PCP -Prescription medication considered, patient comfortable with viscous lidocaine, tylenol -Social Determinant of Health considered which includes tobacco use         Final Clinical Impression(s) / ED Diagnoses Final diagnoses:  Strep pharyngitis    Rx / DC Orders ED Discharge Orders          Ordered    lidocaine (XYLOCAINE) 2 % solution  As needed        08/02/22 1958    acetaminophen (TYLENOL) 500 MG tablet  Every 6 hours PRN        08/02/22 1958              Fayrene Helper, PA-C  08/02/22 1959    Benjiman Core, MD 08/02/22 [phone]
